# Patient Record
Sex: Female | Born: 1955 | Race: White | Hispanic: No | Smoking: Former smoker
Health system: Southern US, Community
[De-identification: ages and names within clinical notes are randomized; demographics above are authoritative.]

## PROBLEM LIST (undated history)

## (undated) DIAGNOSIS — H269 Unspecified cataract: Secondary | ICD-10-CM

## (undated) DIAGNOSIS — I10 Essential (primary) hypertension: Secondary | ICD-10-CM

## (undated) DIAGNOSIS — M549 Dorsalgia, unspecified: Secondary | ICD-10-CM

## (undated) DIAGNOSIS — G8929 Other chronic pain: Secondary | ICD-10-CM

## (undated) DIAGNOSIS — F32A Depression, unspecified: Secondary | ICD-10-CM

## (undated) DIAGNOSIS — F329 Major depressive disorder, single episode, unspecified: Secondary | ICD-10-CM

## (undated) DIAGNOSIS — G43909 Migraine, unspecified, not intractable, without status migrainosus: Secondary | ICD-10-CM

## (undated) DIAGNOSIS — R0602 Shortness of breath: Secondary | ICD-10-CM

## (undated) DIAGNOSIS — F0150 Vascular dementia without behavioral disturbance: Secondary | ICD-10-CM

## (undated) DIAGNOSIS — J449 Chronic obstructive pulmonary disease, unspecified: Secondary | ICD-10-CM

## (undated) DIAGNOSIS — K219 Gastro-esophageal reflux disease without esophagitis: Secondary | ICD-10-CM

## (undated) HISTORY — DX: Other chronic pain: G89.29

## (undated) HISTORY — PX: CHOLECYSTECTOMY: SHX55

## (undated) HISTORY — DX: Dorsalgia, unspecified: M54.9

## (undated) HISTORY — DX: Major depressive disorder, single episode, unspecified: F32.9

## (undated) HISTORY — DX: Essential (primary) hypertension: I10

## (undated) HISTORY — DX: Depression, unspecified: F32.A

---

## 2006-08-21 ENCOUNTER — Ambulatory Visit (HOSPITAL_COMMUNITY): Admission: RE | Admit: 2006-08-21 | Discharge: 2006-08-21 | Payer: Self-pay | Admitting: Internal Medicine

## 2006-08-21 ENCOUNTER — Inpatient Hospital Stay (HOSPITAL_COMMUNITY): Admission: AD | Admit: 2006-08-21 | Discharge: 2006-08-22 | Payer: Self-pay | Admitting: General Surgery

## 2006-08-22 ENCOUNTER — Encounter (INDEPENDENT_AMBULATORY_CARE_PROVIDER_SITE_OTHER): Payer: Self-pay | Admitting: General Surgery

## 2007-11-26 ENCOUNTER — Ambulatory Visit (HOSPITAL_COMMUNITY): Admission: RE | Admit: 2007-11-26 | Discharge: 2007-11-26 | Payer: Self-pay | Admitting: Internal Medicine

## 2009-01-23 ENCOUNTER — Ambulatory Visit (HOSPITAL_COMMUNITY): Admission: RE | Admit: 2009-01-23 | Discharge: 2009-01-23 | Payer: Self-pay | Admitting: Internal Medicine

## 2009-10-02 ENCOUNTER — Encounter (INDEPENDENT_AMBULATORY_CARE_PROVIDER_SITE_OTHER): Payer: Self-pay | Admitting: *Deleted

## 2009-10-18 ENCOUNTER — Encounter (INDEPENDENT_AMBULATORY_CARE_PROVIDER_SITE_OTHER): Payer: Self-pay | Admitting: *Deleted

## 2010-03-23 ENCOUNTER — Ambulatory Visit (HOSPITAL_COMMUNITY)
Admission: RE | Admit: 2010-03-23 | Discharge: 2010-03-23 | Payer: Self-pay | Source: Home / Self Care | Attending: Internal Medicine | Admitting: Internal Medicine

## 2010-04-15 ENCOUNTER — Encounter: Payer: Self-pay | Admitting: Internal Medicine

## 2010-04-25 NOTE — Letter (Signed)
Summary: Unable to Reach, Consult Scheduled  [facility]  [address]   [place], [place] [address]   Phone: [phone]  Fax: [phone]    10/02/2009  DOMINGUE COLTRAIN [address] [place], [place]  [address] Jun 30, 1955   Dear Ms. Hogue,   We have been unable to reach you by phone.  Please contact our office with an updated phone number.  At the recommendation of DR [facility]  we have been asked to schedule you a consult with DR Jena Gauss for ABDOMINAL PAIN.   Please call our office at [phone].     Thank you,    Diana Eves  [facility] R. Roetta Sessions, M.D.    Jonette Eva, M.D. Lorenza Burton, FNP-BC    Tana Coast, PA-C Phone: [phone]    Fax: [phone]

## 2010-04-25 NOTE — Letter (Signed)
Summary: Generic Letter, Intro to Referring  [facility]  [address]   [place], [place] [address]   Phone: [phone]  Fax: [phone]      October 18, 2009             RE: Bonnie Schneider   1955-06-23                 [address]                 [place], [place]  [address]  Dear Bonnie Schneider,  pt was a no show on 04/27/09, cancelled appt on 08/07/09, we are unable to reach patient by phone and mailed letter on 10/02/09.            Sincerely,    Diana Eves  [facility] Ph: [phone]   Fax: [phone]

## 2010-05-27 ENCOUNTER — Emergency Department (HOSPITAL_COMMUNITY): Payer: Medicare Other

## 2010-05-27 ENCOUNTER — Emergency Department (HOSPITAL_COMMUNITY)
Admission: EM | Admit: 2010-05-27 | Discharge: 2010-05-27 | Disposition: A | Discharge status: Home / Self Care | Payer: Medicare Other | Attending: Emergency Medicine | Admitting: Emergency Medicine

## 2010-05-27 DIAGNOSIS — J189 Pneumonia, unspecified organism: Secondary | ICD-10-CM | POA: Insufficient documentation

## 2010-05-27 DIAGNOSIS — G8929 Other chronic pain: Secondary | ICD-10-CM | POA: Insufficient documentation

## 2010-05-27 DIAGNOSIS — M549 Dorsalgia, unspecified: Secondary | ICD-10-CM | POA: Insufficient documentation

## 2010-05-27 DIAGNOSIS — G2581 Restless legs syndrome: Secondary | ICD-10-CM | POA: Insufficient documentation

## 2010-05-27 DIAGNOSIS — Z79899 Other long term (current) drug therapy: Secondary | ICD-10-CM | POA: Insufficient documentation

## 2010-05-27 DIAGNOSIS — J449 Chronic obstructive pulmonary disease, unspecified: Secondary | ICD-10-CM | POA: Insufficient documentation

## 2010-05-27 DIAGNOSIS — I10 Essential (primary) hypertension: Secondary | ICD-10-CM | POA: Insufficient documentation

## 2010-05-27 DIAGNOSIS — J4489 Other specified chronic obstructive pulmonary disease: Secondary | ICD-10-CM | POA: Insufficient documentation

## 2010-05-27 DIAGNOSIS — J40 Bronchitis, not specified as acute or chronic: Secondary | ICD-10-CM | POA: Insufficient documentation

## 2010-05-27 DIAGNOSIS — R079 Chest pain, unspecified: Secondary | ICD-10-CM | POA: Insufficient documentation

## 2010-08-07 NOTE — H&P (Signed)
NAMEBLESS, LISENBY              ACCOUNT NO.:  [id number]   MEDICAL RECORD NO.:  [id number]          PATIENT TYPE:  INP   LOCATION:  A321                          FACILITY:  [facility]   PHYSICIAN:  Dalia Heading, M.D.  DATE OF BIRTH:  11-30-1955   DATE OF ADMISSION:  08/21/2006  DATE OF DISCHARGE:  LH                              HISTORY & PHYSICAL   CHIEF COMPLAINT:  Cholecystitis, cholelithiasis.   HISTORY OF PRESENT ILLNESS:  The patient is a 55 year old white female  who presented to [facility] today for an ultrasound to work up right  upper quadrant abdominal pain, nausea, vomiting.  She was found to have  cholecystitis with cholelithiasis.  She was admitted for further  evaluation treatment.   PAST MEDICAL HISTORY:  Includes hypertension.   PAST SURGICAL HISTORY:  Unremarkable.   CURRENT MEDICATIONS:  1. Enalapril 20 mg p.o. daily.  2. Xanax as needed for anxiety.   ALLERGIES:  CODEINE which makes her nauseated.   REVIEW OF SYSTEMS:  The patient smokes.  She denies any significant  alcohol use.  She denies any other cardiopulmonary difficulties or  bleeding disorders.   PHYSICAL EXAMINATION:  GENERAL:  The patient is a well-developed, well-  nourished white female in no acute distress.  VITAL SIGNS:  She is afebrile and vital signs stable.  LUNGS: Lungs clear to auscultation with equal breath sounds bilaterally.  HEART:  Reveals regular rate and rhythm without S3, S4, or murmurs.  ABDOMEN:  Soft with tenderness on the right upper quadrant to palpation.  No hepatosplenomegaly, masses, or hernias are noted.  Met-7 is  remarkable for potassium 3.3.  Liver profile is within normal limits.  CBC is within normal limits.   IMPRESSION:  Cholecystitis, cholelithiasis.   PLAN:  The patient is scheduled for laparoscopic cholecystectomy on Aug 22, 2006.  Risks and benefits of the procedure including bleeding,  infection, hepatobiliary injury, the possibility of an open  procedure  were fully explained to the patient, gave informed consent.      Dalia Heading, M.D.  Electronically Signed     MAJ/MEDQ  D:  08/21/2006  T:  08/21/2006  Job:  [id number]   cc:   Kirk Ruths, M.D.  Fax: [phone]

## 2010-08-07 NOTE — Op Note (Signed)
Bonnie Schneider, Bonnie Schneider              ACCOUNT NO.:  [id number]   MEDICAL RECORD NO.:  [id number]          PATIENT TYPE:  INP   LOCATION:  A321                          FACILITY:  [facility]   PHYSICIAN:  Dalia Heading, M.D.  DATE OF BIRTH:  [birth year]   DATE OF PROCEDURE:  08/22/2006  DATE OF DISCHARGE:                               OPERATIVE REPORT   PREOPERATIVE DIAGNOSIS:  Cholecystitis, cholelithiasis.   POSTOPERATIVE DIAGNOSIS:  Cholecystitis, cholelithiasis.   PROCEDURE:  Laparoscopic cholecystectomy.   SURGEON:  Dr. Franky Macho   ANESTHESIA:  General endotracheal.   INDICATIONS:  The patient is a 55 year old white female who presents  with biliary colic secondary to cholelithiasis.  Risks and benefits of  the procedure including bleeding, infection, hepatobiliary injury, the  possibility of an open procedure were fully explained to the patient,  who gave informed consent.   PROCEDURE NOTE:  The patient was placed in supine position.  After  induction of general endotracheal anesthesia, the abdomen was prepped  and draped in the usual sterile technique with Betadine.  Surgical site  confirmation was performed.   An infraumbilical incision was made down to the fascia.  Veress needle  was introduced into the abdominal cavity under direct visualization  without difficulty.  The patient was placed in reversed Trendelenburg  position and additional 11-mm trocar was placed in the epigastric region  and 5-mm trocars placed in the right upper quadrant, right flank  regions.  Liver was inspected and noted to be within normal limits.  The  gallbladder was retracted superior and laterally.  The dissection was  begun around the infundibulum of the gallbladder.  The cystic duct was  first identified.  Its juncture to the infundibulum fully identified.  Endo clips placed proximally and distally on the cystic duct and the  cystic duct was divided.  This was likewise done on the cystic  artery.  The gallbladder then freed away from the gallbladder fossa using Bovie  electrocautery.  The gallbladder delivered through the epigastric trocar  site using EndoCatch bag.  The gallbladder fossa was inspected and no  abnormal bleeding or bile leakage was noted.  Surgicel was placed in the  gallbladder fossa.  All fluid and air were then evacuated from the  abdominal cavity prior to removal of the trocars.   All wounds were irrigated with normal saline.  All wounds were injected  with 0.5 cm Sensorcaine.  The infraumbilical fascia as well as  epigastric fascia reapproximated using O Vicryl interrupted sutures.  All skin incisions were closed using staples.  Betadine ointment and dry  sterile dressings were applied.   All tape and needle counts correct at the end of the procedure.  The  patient was extubated in the operating room went back to recovery room  awake and in stable condition.  Complications none.   SPECIMEN:  Gallbladder stones.   BLOOD LOSS:  Minimal.      Dalia Heading, M.D.  Electronically Signed     MAJ/MEDQ  D:  08/22/2006  T:  08/22/2006  Job:  [id number]   cc:  Kirk Ruths, M.D.  Fax: [phone]

## 2010-12-26 ENCOUNTER — Other Ambulatory Visit (HOSPITAL_COMMUNITY): Payer: Self-pay | Admitting: Internal Medicine

## 2010-12-26 DIAGNOSIS — Z139 Encounter for screening, unspecified: Secondary | ICD-10-CM

## 2010-12-31 ENCOUNTER — Ambulatory Visit (HOSPITAL_COMMUNITY)
Admission: RE | Admit: 2010-12-31 | Discharge: 2010-12-31 | Disposition: A | Discharge status: Home / Self Care | Payer: Medicare Other | Source: Ambulatory Visit | Attending: Internal Medicine | Admitting: Internal Medicine

## 2010-12-31 DIAGNOSIS — Z139 Encounter for screening, unspecified: Secondary | ICD-10-CM

## 2010-12-31 DIAGNOSIS — Z1231 Encounter for screening mammogram for malignant neoplasm of breast: Secondary | ICD-10-CM | POA: Insufficient documentation

## 2011-09-23 ENCOUNTER — Ambulatory Visit (HOSPITAL_COMMUNITY)
Admission: RE | Admit: 2011-09-23 | Discharge: 2011-09-23 | Disposition: A | Discharge status: Home / Self Care | Payer: Medicare Other | Source: Ambulatory Visit | Attending: Internal Medicine | Admitting: Internal Medicine

## 2011-09-23 ENCOUNTER — Other Ambulatory Visit (HOSPITAL_COMMUNITY): Payer: Self-pay | Admitting: Internal Medicine

## 2011-09-23 DIAGNOSIS — R109 Unspecified abdominal pain: Secondary | ICD-10-CM | POA: Insufficient documentation

## 2011-09-23 DIAGNOSIS — R1900 Intra-abdominal and pelvic swelling, mass and lump, unspecified site: Secondary | ICD-10-CM | POA: Insufficient documentation

## 2011-09-23 DIAGNOSIS — M25579 Pain in unspecified ankle and joints of unspecified foot: Secondary | ICD-10-CM | POA: Insufficient documentation

## 2011-09-23 DIAGNOSIS — R911 Solitary pulmonary nodule: Secondary | ICD-10-CM | POA: Insufficient documentation

## 2011-09-23 DIAGNOSIS — S93609A Unspecified sprain of unspecified foot, initial encounter: Secondary | ICD-10-CM

## 2011-09-23 DIAGNOSIS — S8990XA Unspecified injury of unspecified lower leg, initial encounter: Secondary | ICD-10-CM | POA: Insufficient documentation

## 2011-09-23 DIAGNOSIS — X500XXA Overexertion from strenuous movement or load, initial encounter: Secondary | ICD-10-CM | POA: Insufficient documentation

## 2011-09-24 ENCOUNTER — Other Ambulatory Visit (HOSPITAL_COMMUNITY): Payer: Self-pay | Admitting: Internal Medicine

## 2011-09-24 DIAGNOSIS — R109 Unspecified abdominal pain: Secondary | ICD-10-CM

## 2011-09-27 ENCOUNTER — Ambulatory Visit (HOSPITAL_COMMUNITY)
Admission: RE | Admit: 2011-09-27 | Discharge: 2011-09-27 | Disposition: A | Discharge status: Home / Self Care | Payer: Medicare Other | Source: Ambulatory Visit | Attending: Internal Medicine | Admitting: Internal Medicine

## 2011-09-27 DIAGNOSIS — K5730 Diverticulosis of large intestine without perforation or abscess without bleeding: Secondary | ICD-10-CM | POA: Insufficient documentation

## 2011-09-27 DIAGNOSIS — D175 Benign lipomatous neoplasm of intra-abdominal organs: Secondary | ICD-10-CM | POA: Insufficient documentation

## 2011-09-27 DIAGNOSIS — R109 Unspecified abdominal pain: Secondary | ICD-10-CM | POA: Insufficient documentation

## 2011-09-27 MED ORDER — IOHEXOL 300 MG/ML  SOLN
100.0000 mL | Freq: Once | INTRAMUSCULAR | Status: AC | PRN
  Administered 2011-09-27: 100 mL via INTRAVENOUS

## 2011-10-02 ENCOUNTER — Encounter (INDEPENDENT_AMBULATORY_CARE_PROVIDER_SITE_OTHER): Payer: Self-pay | Admitting: *Deleted

## 2011-10-21 ENCOUNTER — Encounter (INDEPENDENT_AMBULATORY_CARE_PROVIDER_SITE_OTHER): Payer: Self-pay | Admitting: Internal Medicine

## 2011-10-21 ENCOUNTER — Ambulatory Visit (INDEPENDENT_AMBULATORY_CARE_PROVIDER_SITE_OTHER): Payer: Medicare Other | Admitting: Internal Medicine

## 2011-10-21 VITALS — BP 86/48 | HR 72 | Temp 98.9°F | Ht 61.0 in | Wt 128.7 lb

## 2011-10-21 DIAGNOSIS — R140 Abdominal distension (gaseous): Secondary | ICD-10-CM | POA: Insufficient documentation

## 2011-10-21 DIAGNOSIS — R141 Gas pain: Secondary | ICD-10-CM

## 2011-10-21 DIAGNOSIS — Z1211 Encounter for screening for malignant neoplasm of colon: Secondary | ICD-10-CM | POA: Insufficient documentation

## 2011-10-21 DIAGNOSIS — G8929 Other chronic pain: Secondary | ICD-10-CM | POA: Insufficient documentation

## 2011-10-21 DIAGNOSIS — I10 Essential (primary) hypertension: Secondary | ICD-10-CM | POA: Insufficient documentation

## 2011-10-21 DIAGNOSIS — M549 Dorsalgia, unspecified: Secondary | ICD-10-CM | POA: Insufficient documentation

## 2011-10-21 NOTE — Progress Notes (Signed)
Subjective:     Patient ID: Bonnie Schneider, female   DOB: 1956/02/22, 56 y.o.   MRN: [id number]  HPI Referred to our office by Dr. Sherwood Gambler for abdominal distention x 1 yr.  She tells me that she can eat a cracker and her abdomen will be distended. She has gained about 30 pounds over the past 2-3 yrs.  Appetite is good at night. No weight loss. No abdominal pain.  She has a BM about once every 2 days. No melena or bright red rectal bleeding.  She has no problem with drinking milk.  She has never undergone a colonoscopy in the past.   CT abdomen/pelvis 09/27/2011 :IMPRESSION:  1. No acute inflammatory process within abdomen or pelvis.  2. Status post cholecystectomy.  3. A small lipoma is noted in distal duodenal wall measures 7.5  mm. Small intraluminal lipomas distal small bowel right abdomen.  4. No hydronephrosis or hydroureter. Bilateral renal symmetrical  excretion.  5. Distal colonic diverticula without evidence of acute  Diverticulitis.   10/02/2011 Acute abdomen:IMPRESSION:  1. Ill-defined heterogeneous possible nodular opacities are seen  with the medial aspect of the right lower lung. While possibly  representing atelectasis, these opacities were not seen on prior  examinations. As such, a follow-up chest radiograph in 4 to 6  weeks is recommended to ensure resolution.  2. Moderate colonic stool burden without evidence of obstruction.  These results will be called to the ordering clinician or  representative by the Radiologist Assistant, and communication  documented in the PACS Dashboard.     Review of Systems see hpi Current Outpatient Prescriptions  Medication Sig Dispense Refill  . alprazolam (XANAX) 2 MG tablet Take 2 mg by mouth as needed.       Marland Kitchen amitriptyline (ELAVIL) 50 MG tablet Take 50 mg by mouth at bedtime.      Marland Kitchen aspirin 81 MG tablet Take 81 mg by mouth daily.      . enalapril (VASOTEC) 20 MG tablet Take 20 mg by mouth daily.      . hydrochlorothiazide  (HYDRODIURIL) 25 MG tablet Take 25 mg by mouth daily.      Marland Kitchen HYDROcodone-acetaminophen (LORTAB) 10-500 MG per tablet Take 1 tablet by mouth every 6 (six) hours as needed.      . mirtazapine (REMERON SOL-TAB) 15 MG disintegrating tablet Take 15 mg by mouth at bedtime.      . rizatriptan (MAXALT) 10 MG tablet Take 10 mg by mouth as needed. May repeat in 2 hours if needed      . rOPINIRole (REQUIP) 0.25 MG tablet Take 0.25 mg by mouth 3 (three) times daily.       Past Medical History  Diagnosis Date  . Hypertension   . Chronic back pain   . Depression    Past Surgical History  Procedure Date  . Cholecystectomy    Family Status  Relation Status Death Age  . Mother Alive     left hip fx, hypertension, Leaky valve in heart  . Father Alive     good health  . Sister Alive     abdominal surgery for a rupture   History   Social History  . Marital Status: Divorced    Spouse Name: N/A    Number of Children: N/A  . Years of Education: N/A   Occupational History  . Not on file.   Social History Main Topics  . Smoking status: Current Everyday Smoker  . Smokeless tobacco: Not on  file   Comment: 1 pack a day  . Alcohol Use: No  . Drug Use: No  . Sexually Active: Not on file   Other Topics Concern  . Not on file   Social History Narrative  . No narrative on file   No Known Allergies      Objective:   Physical Exam Filed Vitals:   10/21/11 1520  Height: 5\' 1"  (1.549 m)  Weight: 128 lb 11.2 oz (58.378 kg)   Alert and oriented. Skin warm and dry. Oral mucosa is moist.   . Sclera anicteric, conjunctivae is pink. Thyroid not enlarged. No cervical lymphadenopathy. Lungs clear. Heart regular rate and rhythm.  Abdomen is soft, obese. Bowel sounds are positive. No hepatomegaly. No abdominal masses felt. No tenderness.  No edema to lower extremities.      Assessment:    Abdominal bloating.  Exam today was normal. In need of a screening colonoscopy.      Plan:    Screening  colonoscopy.  Continue Tums as needed.

## 2011-10-21 NOTE — Patient Instructions (Addendum)
Screening colonoscopy with Dr. Karilyn Cota.The risks and benefits such as perforation, bleeding, and infection were reviewed with the patient and is agreeable.

## 2011-10-24 ENCOUNTER — Telehealth (INDEPENDENT_AMBULATORY_CARE_PROVIDER_SITE_OTHER): Payer: Self-pay | Admitting: *Deleted

## 2011-10-24 ENCOUNTER — Other Ambulatory Visit (INDEPENDENT_AMBULATORY_CARE_PROVIDER_SITE_OTHER): Payer: Self-pay | Admitting: *Deleted

## 2011-10-24 DIAGNOSIS — Z1211 Encounter for screening for malignant neoplasm of colon: Secondary | ICD-10-CM

## 2011-10-24 MED ORDER — PEG-KCL-NACL-NASULF-NA ASC-C 100 G PO SOLR: 1.0000 | Freq: Once | ORAL | Status: DC

## 2011-10-24 NOTE — Telephone Encounter (Signed)
Patient needs movi prep 

## 2011-10-25 ENCOUNTER — Encounter (HOSPITAL_COMMUNITY): Payer: Self-pay | Admitting: Pharmacy Technician

## 2011-11-08 ENCOUNTER — Encounter (HOSPITAL_COMMUNITY): Payer: Self-pay | Admitting: *Deleted

## 2011-11-08 ENCOUNTER — Encounter (HOSPITAL_COMMUNITY)
Admission: RE | Disposition: A | Discharge status: Home / Self Care | Payer: Self-pay | Source: Ambulatory Visit | Attending: Internal Medicine

## 2011-11-08 ENCOUNTER — Ambulatory Visit (HOSPITAL_COMMUNITY)
Admission: RE | Admit: 2011-11-08 | Discharge: 2011-11-08 | Disposition: A | Discharge status: Home / Self Care | Payer: Medicare Other | Source: Ambulatory Visit | Attending: Internal Medicine | Admitting: Internal Medicine

## 2011-11-08 DIAGNOSIS — J4489 Other specified chronic obstructive pulmonary disease: Secondary | ICD-10-CM | POA: Insufficient documentation

## 2011-11-08 DIAGNOSIS — K644 Residual hemorrhoidal skin tags: Secondary | ICD-10-CM | POA: Insufficient documentation

## 2011-11-08 DIAGNOSIS — J449 Chronic obstructive pulmonary disease, unspecified: Secondary | ICD-10-CM | POA: Insufficient documentation

## 2011-11-08 DIAGNOSIS — Z79899 Other long term (current) drug therapy: Secondary | ICD-10-CM | POA: Insufficient documentation

## 2011-11-08 DIAGNOSIS — I10 Essential (primary) hypertension: Secondary | ICD-10-CM | POA: Insufficient documentation

## 2011-11-08 DIAGNOSIS — K5730 Diverticulosis of large intestine without perforation or abscess without bleeding: Secondary | ICD-10-CM | POA: Insufficient documentation

## 2011-11-08 DIAGNOSIS — Z1211 Encounter for screening for malignant neoplasm of colon: Secondary | ICD-10-CM

## 2011-11-08 HISTORY — PX: COLONOSCOPY: SHX5424

## 2011-11-08 HISTORY — DX: Migraine, unspecified, not intractable, without status migrainosus: G43.909

## 2011-11-08 HISTORY — DX: Unspecified cataract: H26.9

## 2011-11-08 HISTORY — DX: Shortness of breath: R06.02

## 2011-11-08 HISTORY — DX: Gastro-esophageal reflux disease without esophagitis: K21.9

## 2011-11-08 HISTORY — DX: Chronic obstructive pulmonary disease, unspecified: J44.9

## 2011-11-08 SURGERY — COLONOSCOPY
Anesthesia: Moderate Sedation

## 2011-11-08 MED ORDER — PROMETHAZINE HCL 25 MG/ML IJ SOLN
INTRAMUSCULAR | Status: DC | PRN
  Administered 2011-11-08: 12.5 mg via INTRAVENOUS

## 2011-11-08 MED ORDER — PROMETHAZINE HCL 25 MG/ML IJ SOLN
INTRAMUSCULAR | Status: AC
  Filled 2011-11-08: qty 1

## 2011-11-08 MED ORDER — STERILE WATER FOR IRRIGATION IR SOLN
Status: DC | PRN
  Administered 2011-11-08: 12:00:00

## 2011-11-08 MED ORDER — SODIUM CHLORIDE 0.45 % IV SOLN
Freq: Once | INTRAVENOUS | Status: AC
  Administered 2011-11-08: 20 mL/h via INTRAVENOUS

## 2011-11-08 MED ORDER — MEPERIDINE HCL 50 MG/ML IJ SOLN
INTRAMUSCULAR | Status: DC | PRN
  Administered 2011-11-08 (×2): 25 mg via INTRAVENOUS

## 2011-11-08 MED ORDER — SODIUM CHLORIDE 0.9 % IJ SOLN
INTRAMUSCULAR | Status: AC
  Filled 2011-11-08: qty 10

## 2011-11-08 MED ORDER — MEPERIDINE HCL 50 MG/ML IJ SOLN
INTRAMUSCULAR | Status: AC
  Filled 2011-11-08: qty 1

## 2011-11-08 MED ORDER — MIDAZOLAM HCL 5 MG/5ML IJ SOLN
INTRAMUSCULAR | Status: AC
  Filled 2011-11-08: qty 10

## 2011-11-08 MED ORDER — MIDAZOLAM HCL 5 MG/5ML IJ SOLN
INTRAMUSCULAR | Status: DC | PRN
  Administered 2011-11-08 (×5): 2 mg via INTRAVENOUS

## 2011-11-08 NOTE — H&P (Signed)
Bonnie Schneider is an 56 y.o. female.   Chief Complaint: Patient is here for colonoscopy. HPI: Asian is 56 year old Caucasian female who is here for screening colonoscopy. This is patient's first exam. She denies abdominal pain change in her bowel habits or rectal bleeding. Family history is negative for colorectal carcinoma. She has chronic low back pain and pain medication.  Past Medical History  Diagnosis Date  . Hypertension   . Chronic back pain   . Depression   . COPD (chronic obstructive pulmonary disease)   . Shortness of breath     with exertion  . Migraine     last migraine 11/07/11  . GERD (gastroesophageal reflux disease)   . Immature cataract     Past Surgical History  Procedure Date  . Cholecystectomy     History reviewed. No pertinent family history. Social History:  reports that she has been smoking Cigarettes.  She has a 60 pack-year smoking history. She does not have any smokeless tobacco history on file. She reports that she does not drink alcohol or use illicit drugs.  Allergies: No Known Allergies  Medications Prior to Admission  Medication Sig Dispense Refill  . alprazolam (XANAX) 2 MG tablet Take 2 mg by mouth as needed. Anxiety      . amitriptyline (ELAVIL) 50 MG tablet Take 50 mg by mouth at bedtime.      Marland Kitchen aspirin 81 MG tablet Take 81 mg by mouth daily.      . enalapril (VASOTEC) 20 MG tablet Take 20 mg by mouth daily.      . hydrochlorothiazide (HYDRODIURIL) 25 MG tablet Take 25 mg by mouth daily.      Marland Kitchen HYDROcodone-acetaminophen (LORTAB) 10-500 MG per tablet Take 1 tablet by mouth every 6 (six) hours as needed. Pain      . peg 3350 powder (MOVIPREP) 100 G SOLR Take 1 kit (100 g total) by mouth once.  1 kit  0  . rizatriptan (MAXALT) 10 MG tablet Take 10 mg by mouth as needed. May repeat in 2 hours if needed      . rOPINIRole (REQUIP) 0.25 MG tablet Take 0.25 mg by mouth 3 (three) times daily.        No results found for this or any previous visit  (from the past 48 hour(s)). No results found.  ROS  Blood pressure 168/99, pulse 87, temperature 97.7 F (36.5 C), temperature source Oral, resp. rate 22, height 5\' 1"  (1.549 m), weight 128 lb (58.06 kg), SpO2 93.00%. Physical Exam  Constitutional: She appears well-developed and well-nourished.  HENT:  Mouth/Throat: Oropharynx is clear and moist.  Eyes: Conjunctivae are normal. No scleral icterus.  Neck: No thyromegaly present.  Cardiovascular: Normal rate, regular rhythm and normal heart sounds.   No murmur heard. Respiratory: Effort normal and breath sounds normal.  GI: Soft. She exhibits no distension and no mass. There is tenderness (mild midepigastric tenderness).  Musculoskeletal: She exhibits no edema.  Lymphadenopathy:    She has no cervical adenopathy.  Neurological: She is alert.  Skin: Skin is warm and dry.     Assessment/Plan Average risk screening colonoscopy.  REHMAN,NAJEEB U 11/08/2011, 12:17 PM

## 2011-11-08 NOTE — Op Note (Signed)
COLONOSCOPY PROCEDURE REPORT  PATIENT:  Bonnie Schneider  MR#:  [id number] Birthdate:  1955/04/07, 56 y.o., female Endoscopist:  Dr. Malissa Hippo, MD Referred By:  Dr. Madelin Rear. Sherwood Gambler, MD Procedure Date: 11/08/2011  Procedure:   Colonoscopy  Indications:  Patient is 56 year old Caucasian female was undergoing average risk screening colonoscopy.  Informed Consent:  The procedure and risks were reviewed with the patient and informed consent was obtained.  Medications:  Demerol 50 mg IV Versed 10 mg IV Promethazine 12.5 mg IV in diluted form.  Description of procedure:  After a digital rectal exam was performed, that colonoscope was advanced from the anus through the rectum and colon to the area of the cecum, ileocecal valve and appendiceal orifice. The cecum was deeply intubated. These structures were well-seen and photographed for the record. From the level of the cecum and ileocecal valve, the scope was slowly and cautiously withdrawn. The mucosal surfaces were carefully surveyed utilizing scope tip to flexion to facilitate fold flattening as needed. The scope was pulled down into the rectum where a thorough exam including retroflexion was performed.  Findings:   Prep satisfactory. Scattered diverticula at sigmoid, descending and transverse colon. Most of the diverticula noted at sigmoid colon. Normal rectal mucosa. Small hemorrhoids below the dentate.  Therapeutic/Diagnostic Maneuvers Performed:  None  Complications:  None  Cecal Withdrawal Time:  8 minutes  Impression:  Examination performed to cecum. Data diverticula at sigmoid, descending and transverse colon. Small external hemorrhoids. No evidence of colonic polyps.  Recommendations:  Standard instructions given. Next screening exam in 10 years. Align on probiotic 1 capsule by mouth daily. If bloating persists she will return for office visit.  Janeene Sand U  11/08/2011 12:50 PM  CC: Dr. Cassell Smiles., MD &  Dr. Bonnetta Barry ref. provider found

## 2011-11-14 ENCOUNTER — Encounter (HOSPITAL_COMMUNITY): Payer: Self-pay | Admitting: Internal Medicine

## 2011-12-10 ENCOUNTER — Ambulatory Visit (HOSPITAL_COMMUNITY)
Admission: RE | Admit: 2011-12-10 | Discharge: 2011-12-10 | Disposition: A | Discharge status: Home / Self Care | Payer: Medicare Other | Source: Ambulatory Visit | Attending: Internal Medicine | Admitting: Internal Medicine

## 2011-12-10 ENCOUNTER — Other Ambulatory Visit (HOSPITAL_COMMUNITY): Payer: Self-pay | Admitting: Internal Medicine

## 2011-12-10 DIAGNOSIS — R059 Cough, unspecified: Secondary | ICD-10-CM | POA: Insufficient documentation

## 2011-12-10 DIAGNOSIS — R05 Cough: Secondary | ICD-10-CM

## 2011-12-10 DIAGNOSIS — F17200 Nicotine dependence, unspecified, uncomplicated: Secondary | ICD-10-CM | POA: Insufficient documentation

## 2012-03-21 ENCOUNTER — Encounter (HOSPITAL_COMMUNITY): Payer: Self-pay | Admitting: *Deleted

## 2012-03-21 ENCOUNTER — Emergency Department (HOSPITAL_COMMUNITY): Payer: Medicare Other

## 2012-03-21 ENCOUNTER — Emergency Department (HOSPITAL_COMMUNITY)
Admission: EM | Admit: 2012-03-21 | Discharge: 2012-03-22 | Disposition: A | Discharge status: Home / Self Care | Payer: Medicare Other | Attending: Emergency Medicine | Admitting: Emergency Medicine

## 2012-03-21 DIAGNOSIS — F329 Major depressive disorder, single episode, unspecified: Secondary | ICD-10-CM | POA: Insufficient documentation

## 2012-03-21 DIAGNOSIS — F17200 Nicotine dependence, unspecified, uncomplicated: Secondary | ICD-10-CM | POA: Insufficient documentation

## 2012-03-21 DIAGNOSIS — K219 Gastro-esophageal reflux disease without esophagitis: Secondary | ICD-10-CM | POA: Insufficient documentation

## 2012-03-21 DIAGNOSIS — R509 Fever, unspecified: Secondary | ICD-10-CM | POA: Insufficient documentation

## 2012-03-21 DIAGNOSIS — Z8679 Personal history of other diseases of the circulatory system: Secondary | ICD-10-CM | POA: Insufficient documentation

## 2012-03-21 DIAGNOSIS — R51 Headache: Secondary | ICD-10-CM | POA: Insufficient documentation

## 2012-03-21 DIAGNOSIS — R0602 Shortness of breath: Secondary | ICD-10-CM | POA: Insufficient documentation

## 2012-03-21 DIAGNOSIS — M549 Dorsalgia, unspecified: Secondary | ICD-10-CM | POA: Insufficient documentation

## 2012-03-21 DIAGNOSIS — J441 Chronic obstructive pulmonary disease with (acute) exacerbation: Secondary | ICD-10-CM | POA: Insufficient documentation

## 2012-03-21 DIAGNOSIS — J3489 Other specified disorders of nose and nasal sinuses: Secondary | ICD-10-CM | POA: Insufficient documentation

## 2012-03-21 DIAGNOSIS — Z7982 Long term (current) use of aspirin: Secondary | ICD-10-CM | POA: Insufficient documentation

## 2012-03-21 DIAGNOSIS — Z79899 Other long term (current) drug therapy: Secondary | ICD-10-CM | POA: Insufficient documentation

## 2012-03-21 DIAGNOSIS — I10 Essential (primary) hypertension: Secondary | ICD-10-CM | POA: Insufficient documentation

## 2012-03-21 DIAGNOSIS — G8929 Other chronic pain: Secondary | ICD-10-CM | POA: Insufficient documentation

## 2012-03-21 DIAGNOSIS — R079 Chest pain, unspecified: Secondary | ICD-10-CM | POA: Insufficient documentation

## 2012-03-21 DIAGNOSIS — Z8669 Personal history of other diseases of the nervous system and sense organs: Secondary | ICD-10-CM | POA: Insufficient documentation

## 2012-03-21 DIAGNOSIS — F3289 Other specified depressive episodes: Secondary | ICD-10-CM | POA: Insufficient documentation

## 2012-03-21 MED ORDER — ACETAMINOPHEN 325 MG PO TABS
650.0000 mg | ORAL_TABLET | Freq: Once | ORAL | Status: AC
  Administered 2012-03-21: 650 mg via ORAL
  Filled 2012-03-21: qty 2

## 2012-03-21 MED ORDER — ALBUTEROL SULFATE (5 MG/ML) 0.5% IN NEBU
5.0000 mg | INHALATION_SOLUTION | Freq: Once | RESPIRATORY_TRACT | Status: AC
  Administered 2012-03-21: 5 mg via RESPIRATORY_TRACT
  Filled 2012-03-21: qty 1

## 2012-03-21 MED ORDER — IPRATROPIUM BROMIDE 0.02 % IN SOLN
0.5000 mg | Freq: Once | RESPIRATORY_TRACT | Status: AC
  Administered 2012-03-21: 0.5 mg via RESPIRATORY_TRACT
  Filled 2012-03-21: qty 2.5

## 2012-03-21 MED ORDER — PREDNISONE 50 MG PO TABS
60.0000 mg | ORAL_TABLET | Freq: Once | ORAL | Status: AC
  Administered 2012-03-21: 60 mg via ORAL
  Filled 2012-03-21: qty 1

## 2012-03-21 NOTE — ED Notes (Signed)
Pt states she is short of breath, had a cough and chest pain x2 days. Pt states chest pain worse with cough and a deep breath.

## 2012-03-21 NOTE — ED Notes (Signed)
pt c/o sob, cough, fever, and chest pain x 2 days

## 2012-03-21 NOTE — ED Provider Notes (Signed)
History  Scribed for Bonnie Nielsen, MD, the patient was seen in room APA02/APA02. This chart was scribed by Bonnie Schneider. The patient's care started at 11:10 PM   CSN: [id number]  Arrival date & time 03/21/12  2220   First MD Initiated Contact with Patient 03/21/12 2301      Chief Complaint  Patient presents with  . Cough  . Chest Pain  . Shortness of Breath  . Fever     The history is provided by the patient and a relative. No language interpreter was used.   Bonnie Schneider is a 56 y.o. female who presents to the Emergency Department complaining of chest pain, SOB, and cough that started several days ago.  A family member reports that she has been experiencing congestion over the past month.  She is also experiencing fever and abdominal pain that is exacerbated by coughing.  She denies sore throat.  Pt has h/o asthma.  Pt did not receive a flu shot.  Symptoms MOD in severity  Past Medical History  Diagnosis Date  . Hypertension   . Chronic back pain   . Depression   . COPD (chronic obstructive pulmonary disease)   . Shortness of breath     with exertion  . Migraine     last migraine 11/07/11  . GERD (gastroesophageal reflux disease)   . Immature cataract     Past Surgical History  Procedure Date  . Cholecystectomy   . Colonoscopy 11/08/2011    Procedure: COLONOSCOPY;  Surgeon: Bonnie Hippo, MD;  Location: [facility] SU[facility];  Service: Endoscopy;  Laterality: N/A;  12:15    History reviewed. No pertinent family history.  History  Substance Use Topics  . Smoking status: Current Every Day Smoker -- 1.5 packs/day for 40 years    Types: Cigarettes  . Smokeless tobacco: Not on file     Comment: 1 pack a day  . Alcohol Use: No    OB History    Grav Para Term Preterm Abortions TAB SAB Ect Mult Living                  Review of Systems  Constitutional: Positive for fever.  HENT: Positive for congestion. Negative for sore throat.   Respiratory: Positive for  cough and shortness of breath.   Cardiovascular: Positive for chest pain.  Gastrointestinal: Negative for vomiting.  Genitourinary: Negative for dysuria.  Musculoskeletal: Negative for back pain.  Skin: Negative for rash.  Neurological: Positive for headaches.    Allergies  Review of patient's allergies indicates no known allergies.  Home Medications   Current Outpatient Rx  Name  Route  Sig  Dispense  Refill  . ALPRAZOLAM 2 MG PO TABS   Oral   Take 2 mg by mouth 4 (four) times daily. Anxiety         . AMITRIPTYLINE HCL 50 MG PO TABS   Oral   Take 50 mg by mouth at bedtime.         . ASPIRIN EC 81 MG PO TBEC   Oral   Take 81 mg by mouth daily.         . ENALAPRIL MALEATE 20 MG PO TABS   Oral   Take 20 mg by mouth daily.         Marland Kitchen HYDROCHLOROTHIAZIDE 25 MG PO TABS   Oral   Take 25 mg by mouth daily.         Marland Kitchen HYDROCODONE-ACETAMINOPHEN 10-500 MG PO TABS  Oral   Take 1 tablet by mouth every 6 (six) hours as needed. Pain         . FISH OIL 1000 MG PO CAPS   Oral   Take 3,000 mg by mouth daily.         Marland Kitchen RIZATRIPTAN BENZOATE 10 MG PO TABS   Oral   Take 10 mg by mouth as needed. May repeat in 2 hours if needed           BP 114/92  Pulse 98  Temp 98.1 F (36.7 C)  Resp 20  Ht 5\' 7"  (1.702 m)  Wt 131 lb (59.421 kg)  BMI 20.52 kg/m2  SpO2 91%  Physical Exam  Constitutional: She is oriented to person, place, and time. She appears well-developed and well-nourished.  HENT:  Head: Normocephalic and atraumatic.       Dry mucous membranes.    Neck: Neck supple. No tracheal deviation present.  Cardiovascular: Normal rate and regular rhythm.   Pulmonary/Chest:       Decreased breath sounds bilaterally.  Prolonged expiration with coarse bilateral breath sounds.  Some expiratory wheezes.  Reproducible sternal chest tenderness without crepitus.  Tachypnea   Abdominal: Soft. There is no tenderness.  Musculoskeletal: She exhibits no edema and no  tenderness.  Neurological: She is alert and oriented to person, place, and time.  Skin: No rash noted. She is not diaphoretic.  Psychiatric: She has a normal mood and affect. Her behavior is normal.    ED Course  Procedures   DIAGNOSTIC STUDIES: Oxygen Saturation is 91% on room air, borderline low by my interpretation.    COORDINATION OF CARE:  22:46 Ordered: DG Chest Port 1 View   Labs Reviewed - No data to display Dg Chest Port 1 View  03/21/2012  *RADIOLOGY REPORT*  Clinical Data: Cough and shortness of breath.  PORTABLE CHEST - 1 VIEW  Comparison: Chest radiograph of 12/10/2011  Findings: The lungs are well-aerated and clear.  There is no evidence of focal opacification, pleural effusion or pneumothorax. Minimal calcification is noted at the lung apices.  The cardiomediastinal silhouette is normal in size; scattered calcification is seen within the aortic arch.  No acute osseous abnormalities are seen.  IMPRESSION: No acute cardiopulmonary process seen.   Original Report Authenticated By: Tonia Ghent, M.D.      Date: 03/22/2012  Rate: 95  Rhythm: normal sinus rhythm  QRS Axis: normal  Intervals: normal  ST/T Wave abnormalities: nonspecific ST changes  Conduction Disutrbances:none  Narrative Interpretation:   Old EKG Reviewed: none available  Albuterol, steroids, CXR.  Recheck: Feeling much better after breathing treatment. Lungs sounds clear with improving pulmonary exam. Inhaler provided. Patient has scheduled followup tomorrow Monday morning with primary care physician Dr. Sherwood Schneider. Prescription for avelox, prednisone and Tylenol with Codeine provided.  Pulse ox 93% room air with ambulation - improved, patient declines admission and very much wants to go home MDM  Fever cough and wheezing in a patient with COPD. Chest x-ray and EKG reviewed as above. Medications provided. Vital signs and nursing notes reviewed.   I personally performed the services described in this  documentation, which was scribed in my presence. The recorded information has been reviewed and is accurate.         Bonnie Nielsen, MD 03/22/12 [phone]

## 2012-03-22 MED ORDER — MOXIFLOXACIN HCL 400 MG PO TABS: 400.0000 mg | ORAL_TABLET | Freq: Every day | ORAL | Status: DC

## 2012-03-22 MED ORDER — ALBUTEROL SULFATE HFA 108 (90 BASE) MCG/ACT IN AERS: 1.0000 | INHALATION_SPRAY | Freq: Four times a day (QID) | RESPIRATORY_TRACT | Status: DC | PRN

## 2012-03-22 MED ORDER — ACETAMINOPHEN-CODEINE 120-12 MG/5ML PO SUSP: 5.0000 mL | Freq: Four times a day (QID) | ORAL | Status: DC | PRN

## 2012-03-22 MED ORDER — PREDNISONE 20 MG PO TABS: 60.0000 mg | ORAL_TABLET | Freq: Every day | ORAL | Status: DC

## 2012-03-22 NOTE — ED Notes (Signed)
Pt was at 89-90% before walking jumped to 92-94% during ambulate

## 2013-01-01 ENCOUNTER — Encounter (HOSPITAL_COMMUNITY): Payer: Self-pay | Admitting: Pharmacy Technician

## 2013-01-12 ENCOUNTER — Other Ambulatory Visit: Payer: Self-pay

## 2013-01-12 ENCOUNTER — Encounter (HOSPITAL_COMMUNITY): Payer: Self-pay

## 2013-01-12 ENCOUNTER — Encounter (HOSPITAL_COMMUNITY): Payer: Self-pay | Admitting: Pharmacy Technician

## 2013-01-12 ENCOUNTER — Encounter (HOSPITAL_COMMUNITY)
Admission: RE | Admit: 2013-01-12 | Discharge: 2013-01-12 | Disposition: A | Discharge status: Home / Self Care | Payer: Medicare Other | Source: Ambulatory Visit | Attending: Ophthalmology | Admitting: Ophthalmology

## 2013-01-12 DIAGNOSIS — Z01812 Encounter for preprocedural laboratory examination: Secondary | ICD-10-CM | POA: Insufficient documentation

## 2013-01-12 LAB — BASIC METABOLIC PANEL
BUN: 19 mg/dL (ref 6–23)
CO2: 22 mEq/L (ref 19–32)
Chloride: 102 mEq/L (ref 96–112)
Creatinine, Ser: 0.92 mg/dL (ref 0.50–1.10)
GFR calc Af Amer: 79 mL/min — ABNORMAL LOW (ref >90)
Glucose, Bld: 82 mg/dL (ref 70–99)
Potassium: 4.2 mEq/L (ref 3.5–5.1)

## 2013-01-12 NOTE — Patient Instructions (Addendum)
Bonnie Schneider  01/12/2013   Your procedure is scheduled on:  01/18/2013  Report to [facility] at 12:30 AM.  Call this number if you have problems the morning of surgery: [phone]   Remember:   Do not eat food or drink liquids after midnight.   Take these medicines the morning of surgery with A SIP OF WATER: Vasotec, Elavil, Carafate, Xanax   Do not wear jewelry, make-up or nail polish.  Do not wear lotions, powders, or perfumes. You may wear deodorant.  Do not shave 48 hours prior to surgery. Men may shave face and neck.  Do not bring valuables to the hospital.  [facility] is not responsible                  for any belongings or valuables.               Contacts, dentures or bridgework may not be worn into surgery.  Leave suitcase in the car. After surgery it may be brought to your room.  For patients admitted to the hospital, discharge time is determined by your                treatment team.               Patients discharged the day of surgery will not be allowed to drive  home.  Name and phone number of your driver: melony tenpas ex-husband  Special Instructions: N/A   Please read over the following fact sheets that you were given: Care and Recovery After Surgery    Cataract A cataract is a clouding of the lens of the eye. When a lens becomes cloudy, vision is reduced based on the degree and nature of the clouding. Many cataracts reduce vision to some degree. Some cataracts make people more near-sighted as they develop. Other cataracts increase glare. Cataracts that are ignored and become worse can sometimes look white. The white color can be seen through the pupil. CAUSES   Aging. However, cataracts may occur at any age, even in newborns.  Certain drugs.  Trauma to the eye.  Certain diseases such as diabetes.  Specific eye diseases such as chronic inflammation inside the eye or a sudden attack of a rare form of glaucoma.  Inherited or acquired medical  problems. SYMPTOMS   Gradual, progressive drop in vision in the affected eye.  Severe, rapid visual loss. This most often happens when trauma is the cause. DIAGNOSIS  To detect a cataract, an eye doctor examines the lens. Cataracts are best diagnosed with an exam of the eyes with the pupils enlarged (dilated) by drops.  TREATMENT  For an early cataract, vision may improve by using different eyeglasses or stronger lighting. If that does not help your vision, surgery is the only effective treatment. A cataract needs to be surgically removed when vision loss interferes with your everyday activities, such as driving, reading, or watching TV. A cataract may also have to be removed if it prevents examination or treatment of another eye problem. Surgery removes the cloudy lens and usually replaces it with a substitute lens (intraocular lens, IOL).  At a time when both you and your doctor agree, the cataract will be surgically removed. If you have cataracts in both eyes, only one is usually removed at a time. This allows the operated eye to heal and be out of danger from any possible problems after surgery (such as infection or poor wound healing). In rare cases, a  cataract may be doing damage to your eye. In these cases, your caregiver may advise surgical removal right away. The vast majority of people who have cataract surgery have better vision afterward. HOME CARE INSTRUCTIONS  If you are not planning surgery, you may be asked to do the following:  Use different eyeglasses.  Use stronger or brighter lighting.  Ask your eye doctor about reducing your medicine dose or changing medicines if it is thought that a medicine caused your cataract. Changing medicines does not make the cataract go away on its own.  Become familiar with your surroundings. Poor vision can lead to injury. Avoid bumping into things on the affected side. You are at a higher risk for tripping or falling.  Exercise extreme care when  driving or operating machinery.  Wear sunglasses if you are sensitive to bright light or experiencing problems with glare. SEEK IMMEDIATE MEDICAL CARE IF:   You have a worsening or sudden vision loss.  You notice redness, swelling, or increasing pain in the eye.  You have a fever. Document Released: 03/11/2005 Document Revised: 06/03/2011 Document Reviewed: 11/02/2010 [facility] Patient Information 2014 [place], [place].

## 2013-01-15 MED ORDER — TETRACAINE HCL 0.5 % OP SOLN
OPHTHALMIC | Status: AC
  Filled 2013-01-15: qty 2

## 2013-01-15 MED ORDER — NEOMYCIN-POLYMYXIN-DEXAMETH 3.5-10000-0.1 OP SUSP
OPHTHALMIC | Status: AC
  Filled 2013-01-15: qty 5

## 2013-01-15 MED ORDER — LIDOCAINE HCL 3.5 % OP GEL
OPHTHALMIC | Status: AC
  Filled 2013-01-15: qty 1

## 2013-01-15 MED ORDER — PHENYLEPHRINE HCL 2.5 % OP SOLN
OPHTHALMIC | Status: AC
  Filled 2013-01-15: qty 15

## 2013-01-15 MED ORDER — LIDOCAINE HCL (PF) 1 % IJ SOLN
INTRAMUSCULAR | Status: AC
  Filled 2013-01-15: qty 2

## 2013-01-15 MED ORDER — CYCLOPENTOLATE-PHENYLEPHRINE OP SOLN OPTIME - NO CHARGE
OPHTHALMIC | Status: AC
  Filled 2013-01-15: qty 2

## 2013-01-18 ENCOUNTER — Ambulatory Visit (HOSPITAL_COMMUNITY)
Admission: RE | Admit: 2013-01-18 | Discharge: 2013-01-18 | Disposition: A | Discharge status: Home / Self Care | Payer: Medicare Other | Source: Ambulatory Visit | Attending: Ophthalmology | Admitting: Ophthalmology

## 2013-01-18 ENCOUNTER — Encounter (HOSPITAL_COMMUNITY): Payer: Self-pay | Admitting: *Deleted

## 2013-01-18 ENCOUNTER — Encounter (HOSPITAL_COMMUNITY)
Admission: RE | Disposition: A | Discharge status: Home / Self Care | Payer: Self-pay | Source: Ambulatory Visit | Attending: Ophthalmology

## 2013-01-18 ENCOUNTER — Ambulatory Visit (HOSPITAL_COMMUNITY): Payer: Medicare Other | Admitting: Anesthesiology

## 2013-01-18 ENCOUNTER — Encounter (HOSPITAL_COMMUNITY): Payer: Medicare Other | Admitting: Anesthesiology

## 2013-01-18 DIAGNOSIS — I10 Essential (primary) hypertension: Secondary | ICD-10-CM | POA: Insufficient documentation

## 2013-01-18 DIAGNOSIS — J449 Chronic obstructive pulmonary disease, unspecified: Secondary | ICD-10-CM | POA: Insufficient documentation

## 2013-01-18 DIAGNOSIS — Z01810 Encounter for preprocedural cardiovascular examination: Secondary | ICD-10-CM | POA: Insufficient documentation

## 2013-01-18 DIAGNOSIS — J4489 Other specified chronic obstructive pulmonary disease: Secondary | ICD-10-CM | POA: Insufficient documentation

## 2013-01-18 DIAGNOSIS — H2510 Age-related nuclear cataract, unspecified eye: Secondary | ICD-10-CM | POA: Insufficient documentation

## 2013-01-18 HISTORY — PX: CATARACT EXTRACTION W/PHACO: SHX586

## 2013-01-18 SURGERY — PHACOEMULSIFICATION, CATARACT, WITH IOL INSERTION
Anesthesia: Monitor Anesthesia Care | Site: Eye | Laterality: Right | Wound class: Clean

## 2013-01-18 MED ORDER — LIDOCAINE HCL 3.5 % OP GEL
1.0000 "application " | Freq: Once | OPHTHALMIC | Status: AC
  Administered 2013-01-18: 1 via OPHTHALMIC

## 2013-01-18 MED ORDER — PROVISC 10 MG/ML IO SOLN
INTRAOCULAR | Status: DC | PRN
  Administered 2013-01-18: 8.5 mg via INTRAOCULAR

## 2013-01-18 MED ORDER — FENTANYL CITRATE 0.05 MG/ML IJ SOLN: 25.0000 ug | INTRAMUSCULAR | Status: DC | PRN

## 2013-01-18 MED ORDER — LIDOCAINE HCL (PF) 1 % IJ SOLN
INTRAMUSCULAR | Status: DC | PRN
  Administered 2013-01-18: .6 mL

## 2013-01-18 MED ORDER — LACTATED RINGERS IV SOLN
INTRAVENOUS | Status: DC | PRN
  Administered 2013-01-18: 13:00:00 via INTRAVENOUS

## 2013-01-18 MED ORDER — CYCLOPENTOLATE-PHENYLEPHRINE 0.2-1 % OP SOLN
1.0000 [drp] | OPHTHALMIC | Status: AC
  Administered 2013-01-18 (×3): 1 [drp] via OPHTHALMIC

## 2013-01-18 MED ORDER — FENTANYL CITRATE 0.05 MG/ML IJ SOLN
INTRAMUSCULAR | Status: AC
  Filled 2013-01-18: qty 2

## 2013-01-18 MED ORDER — POVIDONE-IODINE 5 % OP SOLN
OPHTHALMIC | Status: DC | PRN
  Administered 2013-01-18: 1 via OPHTHALMIC

## 2013-01-18 MED ORDER — MIDAZOLAM HCL 2 MG/2ML IJ SOLN
1.0000 mg | INTRAMUSCULAR | Status: DC | PRN
  Administered 2013-01-18: 2 mg via INTRAVENOUS

## 2013-01-18 MED ORDER — ONDANSETRON HCL 4 MG/2ML IJ SOLN: 4.0000 mg | Freq: Once | INTRAMUSCULAR | Status: DC | PRN

## 2013-01-18 MED ORDER — EPINEPHRINE HCL 1 MG/ML IJ SOLN
INTRAMUSCULAR | Status: AC
  Filled 2013-01-18: qty 1

## 2013-01-18 MED ORDER — LACTATED RINGERS IV SOLN
INTRAVENOUS | Status: DC
  Administered 2013-01-18: 1000 mL via INTRAVENOUS

## 2013-01-18 MED ORDER — MIDAZOLAM HCL 5 MG/5ML IJ SOLN
INTRAMUSCULAR | Status: AC
  Filled 2013-01-18: qty 5

## 2013-01-18 MED ORDER — FENTANYL CITRATE 0.05 MG/ML IJ SOLN
25.0000 ug | INTRAMUSCULAR | Status: AC
  Administered 2013-01-18: 25 ug via INTRAVENOUS

## 2013-01-18 MED ORDER — EPINEPHRINE HCL 1 MG/ML IJ SOLN
INTRAOCULAR | Status: DC | PRN
  Administered 2013-01-18: 14:00:00

## 2013-01-18 MED ORDER — TETRACAINE HCL 0.5 % OP SOLN
1.0000 [drp] | OPHTHALMIC | Status: AC
  Administered 2013-01-18 (×3): 1 [drp] via OPHTHALMIC

## 2013-01-18 MED ORDER — NEOMYCIN-POLYMYXIN-DEXAMETH 3.5-10000-0.1 OP SUSP
OPHTHALMIC | Status: DC | PRN
  Administered 2013-01-18: 1 [drp] via OPHTHALMIC

## 2013-01-18 MED ORDER — PHENYLEPHRINE HCL 2.5 % OP SOLN
1.0000 [drp] | OPHTHALMIC | Status: AC
  Administered 2013-01-18 (×3): 1 [drp] via OPHTHALMIC

## 2013-01-18 MED ORDER — BSS IO SOLN
INTRAOCULAR | Status: DC | PRN
  Administered 2013-01-18: 15 mL via INTRAOCULAR

## 2013-01-18 SURGICAL SUPPLY — 32 items

## 2013-01-18 NOTE — Op Note (Signed)
Date of Admission: 01/18/2013  Date of Surgery: 01/18/2013  Pre-Op Dx: Cataract  Right  Eye  Post-Op Dx: Nuclear Cataract  Right  Eye,  Dx Code 366.16  Surgeon: Gemma Payor, M.D.  Assistants: None  Anesthesia: Topical with MAC  Indications: Painless, progressive loss of vision with compromise of daily activities.  Surgery: Cataract Extraction with Intraocular lens Implant Right Eye  Discription: The patient had dilating drops and viscous lidocaine placed into the right eye in the pre-op holding area. After transfer to the operating room, a time out was performed. The patient was then prepped and draped. Beginning with a 75 degree blade a paracentesis port was made at the surgeon's 2 o'clock position. The anterior chamber was then filled with 1% non-preserved lidocaine. This was followed by filling the anterior chamber with Provisc.  A 2.64mm keratome blade was used to make a clear corneal incision at the temporal limbus.  A bent cystatome needle was used to create a continuous tear capsulotomy. Hydrodissection was performed with balanced salt solution on a Fine canula. The lens nucleus was then removed using the phacoemulsification handpiece. Residual cortex was removed with the I&A handpiece. The anterior chamber and capsular bag were refilled with Provisc. A posterior chamber intraocular lens was placed into the capsular bag with it's injector. The implant was positioned with the Kuglan hook. The Provisc was then removed from the anterior chamber and capsular bag with the I&A handpiece. Stromal hydration of the main incision and paracentesis port was performed with BSS on a Fine canula. The wounds were tested for leak which was negative. The patient tolerated the procedure well. There were no operative complications. The patient was then transferred to the recovery room in stable condition.  Complications: None  Specimen: None  EBL: None  Prosthetic device: B&L enVista, MX60, power 22.5D,  SN [id number].

## 2013-01-18 NOTE — Anesthesia Postprocedure Evaluation (Signed)
  Anesthesia Post-op Note  Patient: Bonnie Schneider  Procedure(s) Performed: Procedure(s) with comments: CATARACT EXTRACTION PHACO AND INTRAOCULAR LENS PLACEMENT (IOC) (Right) - CDE:12.85  Patient Location: Short Stay  Anesthesia Type:MAC  Level of Consciousness: awake, alert , oriented and patient cooperative  Airway and Oxygen Therapy: Patient Spontanous Breathing  Post-op Pain: none  Post-op Assessment: Post-op Vital signs reviewed, Patient's Cardiovascular Status Stable, Respiratory Function Stable, Patent Airway and Pain level controlled  Post-op Vital Signs: Reviewed and stable  Complications: No apparent anesthesia complications

## 2013-01-18 NOTE — Anesthesia Preprocedure Evaluation (Signed)
Anesthesia Evaluation  Patient identified by MRN, date of birth, ID band Patient awake    Reviewed: Allergy & Precautions, H&P , NPO status , Patient's Chart, lab work & pertinent test results  Airway Mallampati: II TM Distance: >3 FB     Dental  (+) Edentulous Upper and Edentulous Lower   Pulmonary shortness of breath, COPD breath sounds clear to auscultation        Cardiovascular hypertension, Pt. on medications Rhythm:Regular Rate:Normal     Neuro/Psych  Headaches, PSYCHIATRIC DISORDERS Depression    GI/Hepatic GERD-  ,  Endo/Other    Renal/GU      Musculoskeletal   Abdominal   Peds  Hematology   Anesthesia Other Findings   Reproductive/Obstetrics                           Anesthesia Physical Anesthesia Plan  ASA: III  Anesthesia Plan: MAC   Post-op Pain Management:    Induction: Intravenous  Airway Management Planned: Nasal Cannula  Additional Equipment:   Intra-op Plan:   Post-operative Plan:   Informed Consent: I have reviewed the patients History and Physical, chart, labs and discussed the procedure including the risks, benefits and alternatives for the proposed anesthesia with the patient or authorized representative who has indicated his/her understanding and acceptance.     Plan Discussed with:   Anesthesia Plan Comments:         Anesthesia Quick Evaluation  

## 2013-01-18 NOTE — Transfer of Care (Signed)
Immediate Anesthesia Transfer of Care Note  Patient: Bonnie Schneider  Procedure(s) Performed: Procedure(s) with comments: CATARACT EXTRACTION PHACO AND INTRAOCULAR LENS PLACEMENT (IOC) (Right) - CDE:12.85  Patient Location: Short Stay  Anesthesia Type:MAC  Level of Consciousness: awake, alert , oriented and patient cooperative  Airway & Oxygen Therapy: Patient Spontanous Breathing  Post-op Assessment: Report given to PACU RN and Post -op Vital signs reviewed and stable  Post vital signs: Reviewed and stable  Complications: No apparent anesthesia complications

## 2013-01-18 NOTE — H&P (Signed)
I have reviewed the H&P, the patient was re-examined, and I have identified no interval changes in medical condition and plan of care since the history and physical of record  

## 2013-01-20 ENCOUNTER — Encounter (HOSPITAL_COMMUNITY): Payer: Self-pay | Admitting: Ophthalmology

## 2013-01-26 ENCOUNTER — Encounter (HOSPITAL_COMMUNITY): Payer: Self-pay | Admitting: Pharmacy Technician

## 2013-01-27 ENCOUNTER — Encounter (HOSPITAL_COMMUNITY): Admission: RE | Admit: 2013-01-27 | Payer: Medicare Other | Source: Ambulatory Visit

## 2013-01-27 MED ORDER — PHENYLEPHRINE HCL 2.5 % OP SOLN
OPHTHALMIC | Status: AC
  Filled 2013-01-27: qty 15

## 2013-01-28 ENCOUNTER — Encounter (HOSPITAL_COMMUNITY): Payer: Medicare Other | Admitting: Anesthesiology

## 2013-01-28 ENCOUNTER — Encounter (HOSPITAL_COMMUNITY): Payer: Self-pay | Admitting: Ophthalmology

## 2013-01-28 ENCOUNTER — Encounter (HOSPITAL_COMMUNITY)
Admission: RE | Disposition: A | Discharge status: Home / Self Care | Payer: Self-pay | Source: Ambulatory Visit | Attending: Ophthalmology

## 2013-01-28 ENCOUNTER — Ambulatory Visit (HOSPITAL_COMMUNITY)
Admission: RE | Admit: 2013-01-28 | Discharge: 2013-01-28 | Disposition: A | Discharge status: Home / Self Care | Payer: Medicare Other | Source: Ambulatory Visit | Attending: Ophthalmology | Admitting: Ophthalmology

## 2013-01-28 ENCOUNTER — Ambulatory Visit (HOSPITAL_COMMUNITY): Payer: Medicare Other | Admitting: Anesthesiology

## 2013-01-28 DIAGNOSIS — H2510 Age-related nuclear cataract, unspecified eye: Secondary | ICD-10-CM | POA: Insufficient documentation

## 2013-01-28 DIAGNOSIS — J4489 Other specified chronic obstructive pulmonary disease: Secondary | ICD-10-CM | POA: Insufficient documentation

## 2013-01-28 DIAGNOSIS — J449 Chronic obstructive pulmonary disease, unspecified: Secondary | ICD-10-CM | POA: Insufficient documentation

## 2013-01-28 DIAGNOSIS — I10 Essential (primary) hypertension: Secondary | ICD-10-CM | POA: Insufficient documentation

## 2013-01-28 HISTORY — PX: CATARACT EXTRACTION W/PHACO: SHX586

## 2013-01-28 SURGERY — PHACOEMULSIFICATION, CATARACT, WITH IOL INSERTION
Anesthesia: Monitor Anesthesia Care | Site: Eye | Laterality: Left | Wound class: Clean

## 2013-01-28 MED ORDER — MIDAZOLAM HCL 5 MG/5ML IJ SOLN
INTRAMUSCULAR | Status: AC
  Filled 2013-01-28: qty 5

## 2013-01-28 MED ORDER — LIDOCAINE HCL 3.5 % OP GEL
1.0000 "application " | Freq: Once | OPHTHALMIC | Status: AC
  Administered 2013-01-28: 1 via OPHTHALMIC

## 2013-01-28 MED ORDER — NEOMYCIN-POLYMYXIN-DEXAMETH 3.5-10000-0.1 OP SUSP
OPHTHALMIC | Status: AC
  Filled 2013-01-28: qty 5

## 2013-01-28 MED ORDER — PROVISC 10 MG/ML IO SOLN
INTRAOCULAR | Status: DC | PRN
  Administered 2013-01-28: 0.85 mL via INTRAOCULAR

## 2013-01-28 MED ORDER — CYCLOPENTOLATE-PHENYLEPHRINE 0.2-1 % OP SOLN
1.0000 [drp] | OPHTHALMIC | Status: AC
  Administered 2013-01-28 (×2): 1 [drp] via OPHTHALMIC

## 2013-01-28 MED ORDER — FENTANYL CITRATE 0.05 MG/ML IJ SOLN
INTRAMUSCULAR | Status: AC
  Filled 2013-01-28: qty 2

## 2013-01-28 MED ORDER — POVIDONE-IODINE 5 % OP SOLN
OPHTHALMIC | Status: DC | PRN
  Administered 2013-01-28: 1 via OPHTHALMIC

## 2013-01-28 MED ORDER — BSS IO SOLN
INTRAOCULAR | Status: DC | PRN
  Administered 2013-01-28: 15 mL via INTRAOCULAR

## 2013-01-28 MED ORDER — MIDAZOLAM HCL 2 MG/2ML IJ SOLN
1.0000 mg | INTRAMUSCULAR | Status: DC | PRN
Start: 2013-01-28 — End: 2013-01-28
  Administered 2013-01-28: 2 mg via INTRAVENOUS

## 2013-01-28 MED ORDER — LACTATED RINGERS IV SOLN
INTRAVENOUS | Status: DC
  Administered 2013-01-28: 14:00:00 via INTRAVENOUS

## 2013-01-28 MED ORDER — CYCLOPENTOLATE-PHENYLEPHRINE OP SOLN OPTIME - NO CHARGE
OPHTHALMIC | Status: AC
  Filled 2013-01-28: qty 2

## 2013-01-28 MED ORDER — EPINEPHRINE HCL 1 MG/ML IJ SOLN
INTRAOCULAR | Status: DC | PRN
  Administered 2013-01-28: 14:00:00

## 2013-01-28 MED ORDER — TETRACAINE HCL 0.5 % OP SOLN
1.0000 [drp] | OPHTHALMIC | Status: AC
  Administered 2013-01-28 (×2): 1 [drp] via OPHTHALMIC

## 2013-01-28 MED ORDER — LIDOCAINE HCL 3.5 % OP GEL
OPHTHALMIC | Status: AC
  Filled 2013-01-28: qty 1

## 2013-01-28 MED ORDER — ONDANSETRON HCL 4 MG/2ML IJ SOLN: 4.0000 mg | Freq: Once | INTRAMUSCULAR | Status: DC | PRN

## 2013-01-28 MED ORDER — LIDOCAINE HCL (PF) 1 % IJ SOLN
INTRAMUSCULAR | Status: AC
  Filled 2013-01-28: qty 2

## 2013-01-28 MED ORDER — NEOMYCIN-POLYMYXIN-DEXAMETH 3.5-10000-0.1 OP SUSP
OPHTHALMIC | Status: DC | PRN
  Administered 2013-01-28: 1 [drp] via OPHTHALMIC

## 2013-01-28 MED ORDER — PHENYLEPHRINE HCL 2.5 % OP SOLN
1.0000 [drp] | OPHTHALMIC | Status: AC
  Administered 2013-01-28 (×2): 1 [drp] via OPHTHALMIC

## 2013-01-28 MED ORDER — LIDOCAINE 3.5 % OP GEL OPTIME - NO CHARGE
OPHTHALMIC | Status: DC | PRN
  Administered 2013-01-28: 1 [drp] via OPHTHALMIC

## 2013-01-28 MED ORDER — FENTANYL CITRATE 0.05 MG/ML IJ SOLN
25.0000 ug | INTRAMUSCULAR | Status: AC
  Administered 2013-01-28 (×2): 25 ug via INTRAVENOUS

## 2013-01-28 MED ORDER — EPINEPHRINE HCL 1 MG/ML IJ SOLN
INTRAMUSCULAR | Status: AC
  Filled 2013-01-28: qty 1

## 2013-01-28 MED ORDER — LIDOCAINE HCL (PF) 1 % IJ SOLN
INTRAMUSCULAR | Status: DC | PRN
  Administered 2013-01-28: .4 mL

## 2013-01-28 MED ORDER — FENTANYL CITRATE 0.05 MG/ML IJ SOLN: 25.0000 ug | INTRAMUSCULAR | Status: DC | PRN

## 2013-01-28 SURGICAL SUPPLY — 32 items
CAPSULAR TENSION RING-AMO (OPHTHALMIC RELATED) IMPLANT
CLOTH BEACON ORANGE TIMEOUT ST (SAFETY) ×2 IMPLANT
EYE SHIELD UNIVERSAL CLEAR (GAUZE/BANDAGES/DRESSINGS) ×2 IMPLANT
GLOVE BIO SURGEON STRL SZ 6.5 (GLOVE) IMPLANT
GLOVE BIOGEL PI IND STRL 6.5 (GLOVE) ×1 IMPLANT
GLOVE BIOGEL PI IND STRL 7.0 (GLOVE) IMPLANT
GLOVE BIOGEL PI IND STRL 7.5 (GLOVE) IMPLANT
GLOVE BIOGEL PI INDICATOR 6.5 (GLOVE) ×1
GLOVE BIOGEL PI INDICATOR 7.0 (GLOVE)
GLOVE BIOGEL PI INDICATOR 7.5 (GLOVE)
GLOVE ECLIPSE 6.5 STRL STRAW (GLOVE) IMPLANT
GLOVE ECLIPSE 7.0 STRL STRAW (GLOVE) IMPLANT
GLOVE ECLIPSE 7.5 STRL STRAW (GLOVE) IMPLANT
GLOVE EXAM NITRILE LRG STRL (GLOVE) IMPLANT
GLOVE EXAM NITRILE MD LF STRL (GLOVE) IMPLANT
GLOVE SKINSENSE NS SZ6.5 (GLOVE)
GLOVE SKINSENSE NS SZ7.0 (GLOVE)
GLOVE SKINSENSE STRL SZ6.5 (GLOVE) IMPLANT
GLOVE SKINSENSE STRL SZ7.0 (GLOVE) IMPLANT
KIT VITRECTOMY (OPHTHALMIC RELATED) IMPLANT
PAD ARMBOARD 7.5X6 YLW CONV (MISCELLANEOUS) ×2 IMPLANT
PROC W NO LENS (INTRAOCULAR LENS)
PROC W SPEC LENS (INTRAOCULAR LENS)
PROCESS W NO LENS (INTRAOCULAR LENS) IMPLANT
PROCESS W SPEC LENS (INTRAOCULAR LENS) IMPLANT
RING MALYGIN (MISCELLANEOUS) IMPLANT
SIGHTPATH CAT PROC W REG LENS (Ophthalmic Related) ×2 IMPLANT
SYR TB 1ML LL NO SAFETY (SYRINGE) ×2 IMPLANT
TAPE SURG TRANSPORE 1 IN (GAUZE/BANDAGES/DRESSINGS) ×1 IMPLANT
TAPE SURGICAL TRANSPORE 1 IN (GAUZE/BANDAGES/DRESSINGS) ×1
VISCOELASTIC ADDITIONAL (OPHTHALMIC RELATED) IMPLANT
WATER STERILE IRR 250ML POUR (IV SOLUTION) ×2 IMPLANT

## 2013-01-28 NOTE — Transfer of Care (Signed)
Immediate Anesthesia Transfer of Care Note  Patient: Bonnie Schneider  Procedure(s) Performed: Procedure(s) with comments: CATARACT EXTRACTION PHACO AND INTRAOCULAR LENS PLACEMENT (IOC) (Left) - CDE:7.93  Patient Location: Short Stay  Anesthesia Type:MAC  Level of Consciousness: awake, alert , oriented and patient cooperative  Airway & Oxygen Therapy: Patient Spontanous Breathing  Post-op Assessment: Report given to PACU RN and Post -op Vital signs reviewed and stable  Post vital signs: Reviewed and stable  Complications: No apparent anesthesia complications

## 2013-01-28 NOTE — Anesthesia Preprocedure Evaluation (Signed)
Anesthesia Evaluation  Patient identified by MRN, date of birth, ID band Patient awake    Reviewed: Allergy & Precautions, H&P , NPO status , Patient's Chart, lab work & pertinent test results  Airway Mallampati: II TM Distance: >3 FB     Dental  (+) Edentulous Upper and Edentulous Lower   Pulmonary shortness of breath, COPD breath sounds clear to auscultation        Cardiovascular hypertension, Pt. on medications Rhythm:Regular Rate:Normal     Neuro/Psych  Headaches, PSYCHIATRIC DISORDERS Depression    GI/Hepatic GERD-  ,  Endo/Other    Renal/GU      Musculoskeletal   Abdominal   Peds  Hematology   Anesthesia Other Findings   Reproductive/Obstetrics                           Anesthesia Physical Anesthesia Plan  ASA: III  Anesthesia Plan: MAC   Post-op Pain Management:    Induction: Intravenous  Airway Management Planned: Nasal Cannula  Additional Equipment:   Intra-op Plan:   Post-operative Plan:   Informed Consent: I have reviewed the patients History and Physical, chart, labs and discussed the procedure including the risks, benefits and alternatives for the proposed anesthesia with the patient or authorized representative who has indicated his/her understanding and acceptance.     Plan Discussed with:   Anesthesia Plan Comments:         Anesthesia Quick Evaluation

## 2013-01-28 NOTE — Anesthesia Postprocedure Evaluation (Signed)
  Anesthesia Post-op Note  Patient: Bonnie Schneider  Procedure(s) Performed: Procedure(s) with comments: CATARACT EXTRACTION PHACO AND INTRAOCULAR LENS PLACEMENT (IOC) (Left) - CDE:7.93  Patient Location: Short Stay  Anesthesia Type:MAC  Level of Consciousness: awake, alert , oriented and patient cooperative  Airway and Oxygen Therapy: Patient Spontanous Breathing  Post-op Pain: none  Post-op Assessment: Post-op Vital signs reviewed, Patient's Cardiovascular Status Stable, Respiratory Function Stable, Patent Airway, No signs of Nausea or vomiting and Pain level controlled  Post-op Vital Signs: Reviewed and stable  Complications: No apparent anesthesia complications

## 2013-01-28 NOTE — Anesthesia Procedure Notes (Signed)
Procedure Name: MAC Date/Time: 01/28/2013 2:22 PM Performed by: Pernell Dupre, Riyan Haile A Pre-anesthesia Checklist: Patient identified, Timeout performed, Emergency Drugs available, Suction available and Patient being monitored Oxygen Delivery Method: Nasal cannula

## 2013-01-28 NOTE — Preoperative (Signed)
Beta Blockers   Reason not to administer Beta Blockers:Not Applicable 

## 2013-01-28 NOTE — H&P (Signed)
I have reviewed the H&P, the patient was re-examined, and I have identified no interval changes in medical condition and plan of care since the history and physical of record  

## 2013-01-28 NOTE — Op Note (Signed)
Date of Admission: 01/28/2013  Date of Surgery: 01/28/2013  Pre-Op Dx: Cataract  Left  Eye  Post-Op Dx: Cataract  Left  Eye,  Dx Code 366.16  Surgeon: Gemma Payor, M.D.  Assistants: None  Anesthesia: Topical with MAC  Indications: Painless, progressive loss of vision with compromise of daily activities.  Surgery: Cataract Extraction with Intraocular lens Implant Left Eye  Discription: The patient had dilating drops and viscous lidocaine placed into the left eye in the pre-op holding area. After transfer to the operating room, a time out was performed. The patient was then prepped and draped. Beginning with a 75 degree blade a paracentesis port was made at the surgeon's 2 o'clock position. The anterior chamber was then filled with 1% non-preserved lidocaine. This was followed by filling the anterior chamber with Provisc. A 2.19mm keratome blade was used to make a clear corneal incision at the temporal limbus. A bent cystatome needle was used to create a continuous tear capsulotomy. Hydrodissection was performed with balanced salt solution on a Fine canula. The lens nucleus was then removed using the phacoemulsification handpiece. Residual cortex was removed with the I&A handpiece. The anterior chamber and capsular bag were refilled with Provisc. A posterior chamber intraocular lens was placed into the capsular bag with it's injector. The implant was positioned with the Kuglan hook. The Provisc was then removed from the anterior chamber and capsular bag with the I&A handpiece. Stromal hydration of the main incision and paracentesis port was performed with BSS on a Fine canula. The wounds were tested for leak which was negative. The patient tolerated the procedure well. There were no operative complications. The patient was then transferred to the recovery room in stable condition.  Complications: None  Specimen: None  EBL: None  Prosthetic device: B&L enVista, MX60, power 22.0D, SN  [id number].

## 2013-02-01 ENCOUNTER — Encounter (HOSPITAL_COMMUNITY): Payer: Self-pay | Admitting: Ophthalmology

## 2013-06-29 ENCOUNTER — Ambulatory Visit (HOSPITAL_COMMUNITY)
Admission: RE | Admit: 2013-06-29 | Discharge: 2013-06-29 | Disposition: A | Discharge status: Home / Self Care | Payer: Medicare Other | Source: Ambulatory Visit | Attending: Internal Medicine | Admitting: Internal Medicine

## 2013-06-29 ENCOUNTER — Other Ambulatory Visit (HOSPITAL_COMMUNITY): Payer: Self-pay | Admitting: Internal Medicine

## 2013-06-29 DIAGNOSIS — M545 Low back pain, unspecified: Secondary | ICD-10-CM | POA: Insufficient documentation

## 2013-06-29 DIAGNOSIS — M549 Dorsalgia, unspecified: Secondary | ICD-10-CM

## 2013-06-29 DIAGNOSIS — M51379 Other intervertebral disc degeneration, lumbosacral region without mention of lumbar back pain or lower extremity pain: Secondary | ICD-10-CM | POA: Insufficient documentation

## 2013-06-29 DIAGNOSIS — M5137 Other intervertebral disc degeneration, lumbosacral region: Secondary | ICD-10-CM | POA: Insufficient documentation

## 2013-06-29 DIAGNOSIS — M79609 Pain in unspecified limb: Secondary | ICD-10-CM | POA: Insufficient documentation

## 2013-12-28 ENCOUNTER — Telehealth: Payer: Self-pay | Admitting: Neurology

## 2013-12-28 NOTE — Telephone Encounter (Signed)
Pt had a death in the family and canceled her appt on 12-29-13

## 2013-12-29 ENCOUNTER — Ambulatory Visit: Payer: Medicare Other | Admitting: Neurology

## 2013-12-31 ENCOUNTER — Ambulatory Visit: Payer: Medicare Other | Admitting: Neurology

## 2014-01-03 ENCOUNTER — Telehealth: Payer: Self-pay | Admitting: Neurology

## 2014-01-03 NOTE — Telephone Encounter (Signed)
Pt cancelled 12/31/13 NP appt w/ Dr. Posey Pronto. It was documented in the EPIC referral but we need to make sure the referring provider is notified as well, I do not believe the referring provider is part of [place] and will not be able to view the notification via EPIC. / Sherri S>

## 2014-01-03 NOTE — Telephone Encounter (Signed)
Called and notified the referring Dr office of cancel appt

## 2014-04-02 IMAGING — CR DG ABDOMEN ACUTE W/ 1V CHEST
3 series · 3 of 3 positions shown · non-contrast
Comparison: Chest radiograph - 05/27/2010; 08/21/2006

CLINICAL DATA: Abdominal pain and swelling

ACUTE ABDOMEN SERIES (ABDOMEN 2 VIEW & CHEST 1 VIEW)

[view not recorded (1 of 3)]
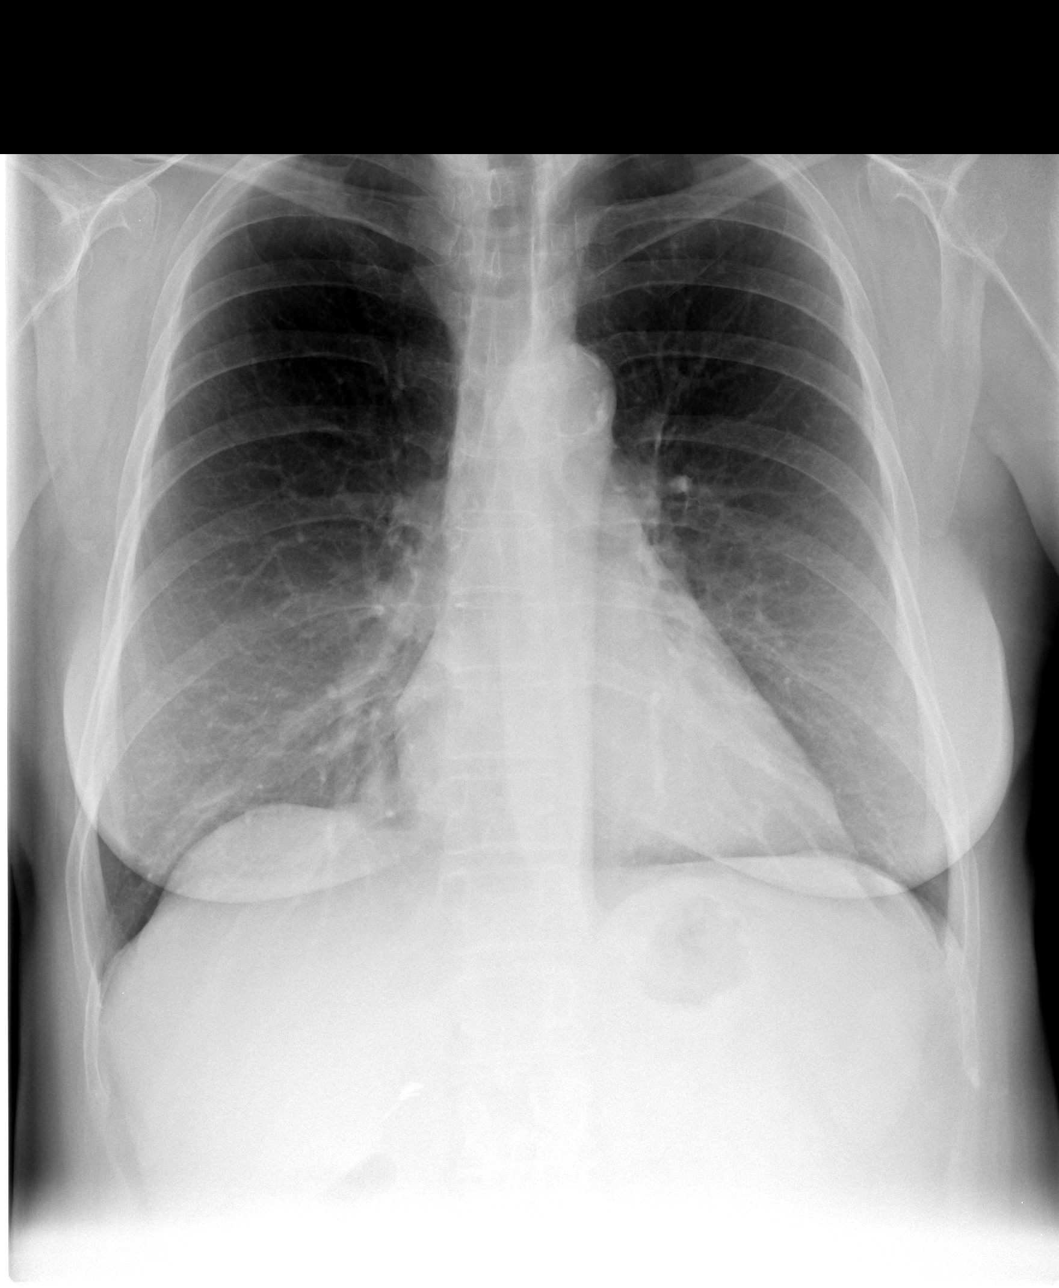

[view not recorded (2 of 3)]
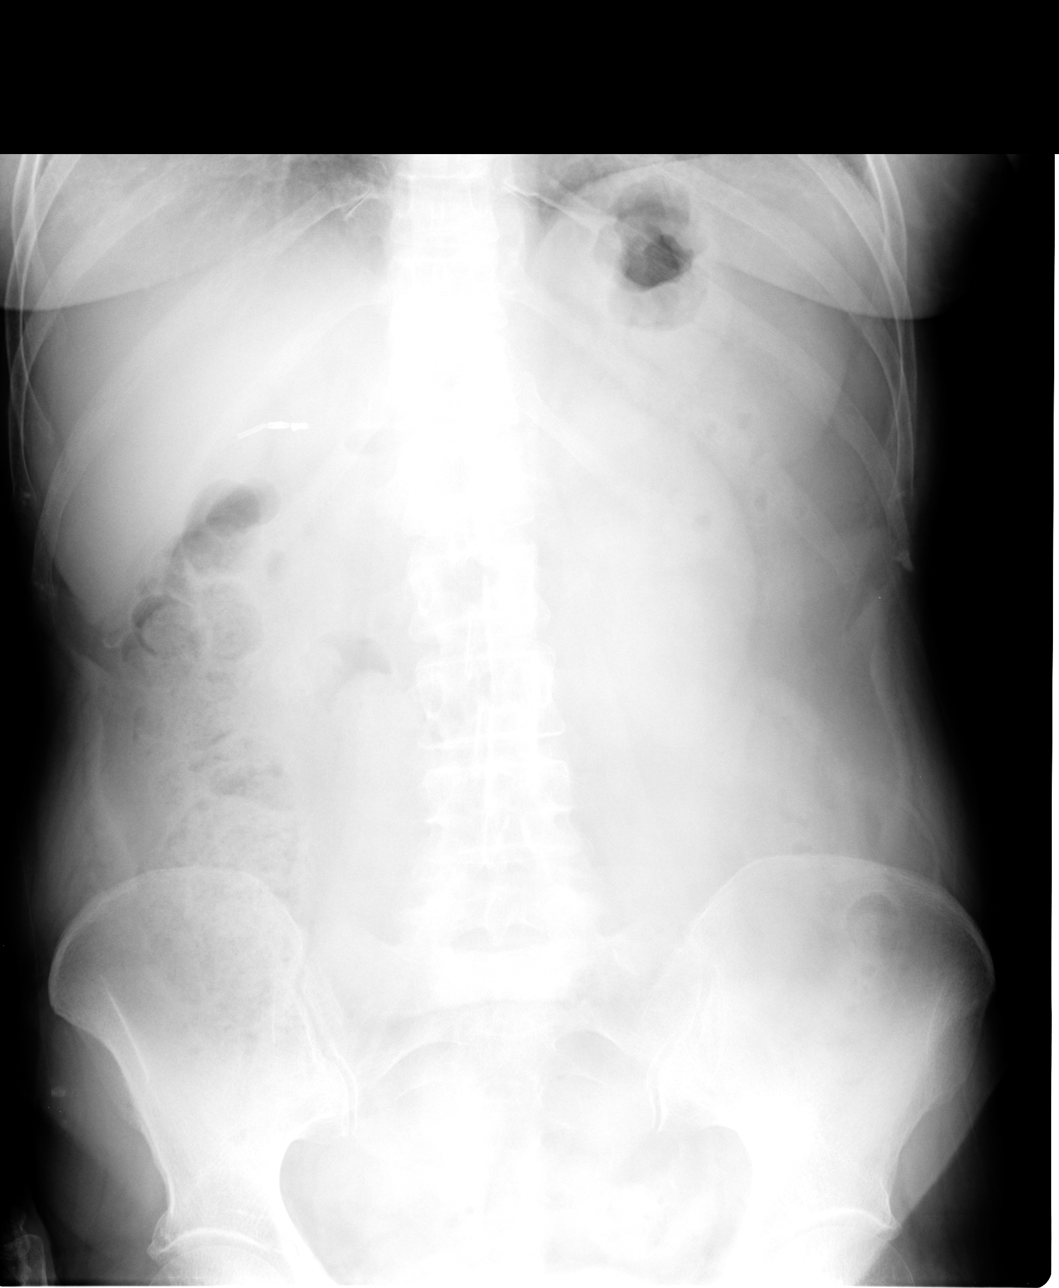

[view not recorded (3 of 3)]
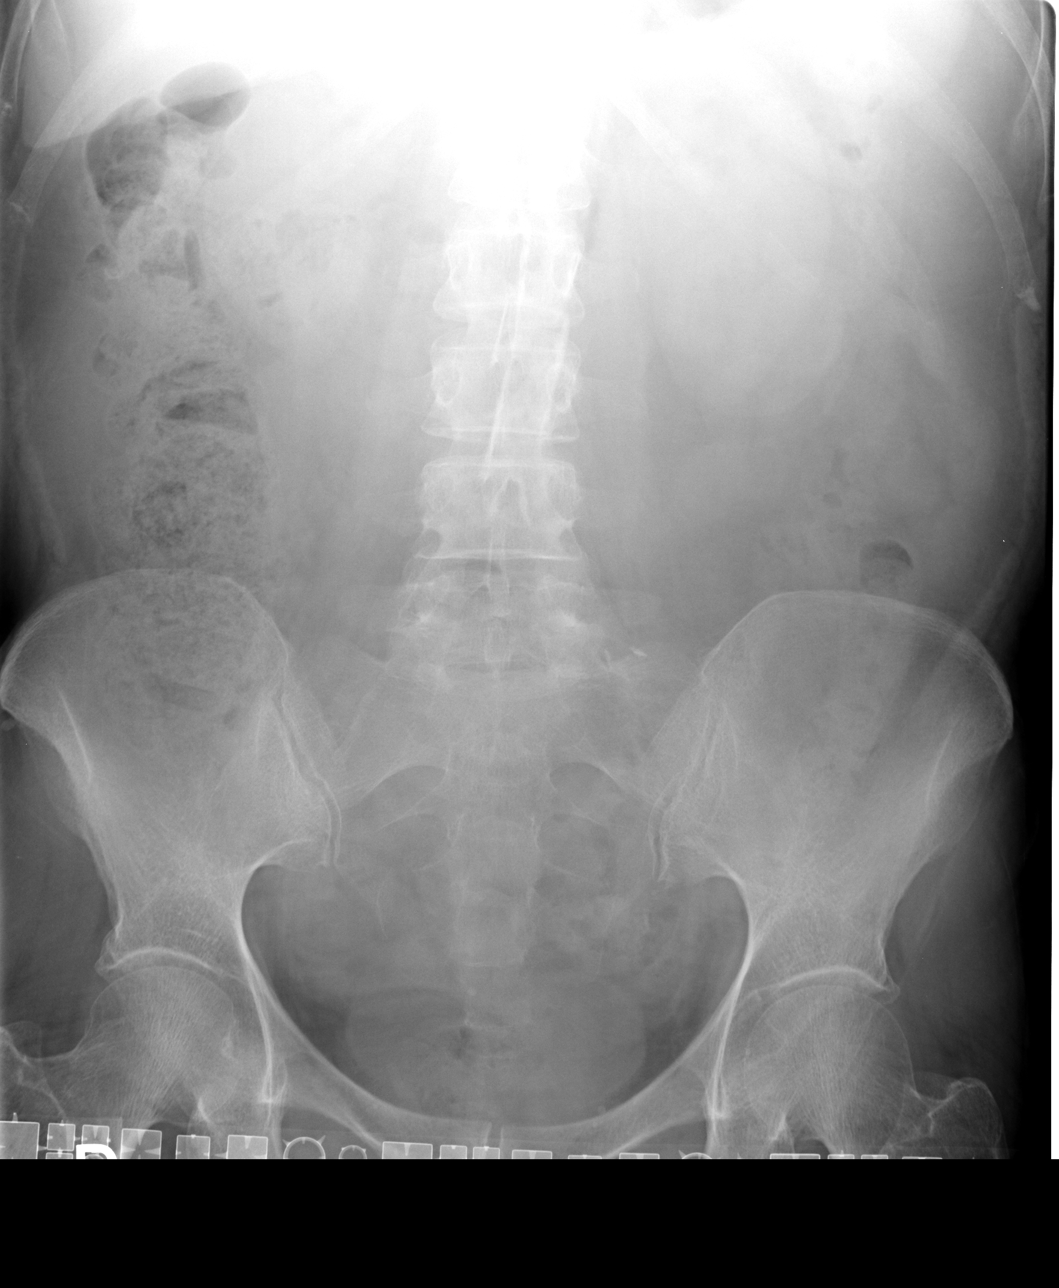

[3 of 3 positions shown; findings below may reference images not displayed]

FINDINGS: Unchanged cardiac silhouette and mediastinal contours with
atherosclerotic calcifications within the thoracic aorta. Overall
improved aeration of the bilateral lung bases. Ill-defined
heterogeneous possible nodular opacities are seen within medial
aspect of the right lower lung.  No definite pleural effusion or
pneumothorax.

Moderate colonic stool burden without definite evidence of
obstruction.  No pneumoperitoneum, pneumatosis or portal venous
gas.  No definite abnormal intra-abdominal calcifications.  Post
cholecystectomy.

Mild scoliotic curvature of the thoracolumbar spine.
IMPRESSION: 1.  Ill-defined heterogeneous possible nodular opacities are seen
with the medial aspect of the right lower lung.  While possibly
representing atelectasis, these opacities were not seen on prior
examinations.  As such, a follow-up chest radiograph in 4 to 6
weeks is recommended to ensure resolution.

 2.  Moderate colonic stool burden without evidence of obstruction.

These results will be called to the ordering clinician or
representative by the Radiologist Assistant, and communication
documented in the PACS Dashboard.

## 2014-07-21 ENCOUNTER — Emergency Department (HOSPITAL_COMMUNITY)
Admission: EM | Admit: 2014-07-21 | Discharge: 2014-07-21 | Discharge status: Against Medical Advice | Payer: Medicare Other | Attending: Emergency Medicine | Admitting: Emergency Medicine

## 2014-07-21 ENCOUNTER — Encounter (HOSPITAL_COMMUNITY): Payer: Self-pay | Admitting: *Deleted

## 2014-07-21 DIAGNOSIS — I10 Essential (primary) hypertension: Secondary | ICD-10-CM | POA: Insufficient documentation

## 2014-07-21 DIAGNOSIS — J441 Chronic obstructive pulmonary disease with (acute) exacerbation: Secondary | ICD-10-CM | POA: Insufficient documentation

## 2014-07-21 DIAGNOSIS — Z8719 Personal history of other diseases of the digestive system: Secondary | ICD-10-CM | POA: Insufficient documentation

## 2014-07-21 DIAGNOSIS — Z720 Tobacco use: Secondary | ICD-10-CM | POA: Diagnosis not present

## 2014-07-21 DIAGNOSIS — Z7982 Long term (current) use of aspirin: Secondary | ICD-10-CM | POA: Insufficient documentation

## 2014-07-21 DIAGNOSIS — Z79899 Other long term (current) drug therapy: Secondary | ICD-10-CM | POA: Diagnosis not present

## 2014-07-21 DIAGNOSIS — M545 Low back pain: Secondary | ICD-10-CM | POA: Insufficient documentation

## 2014-07-21 DIAGNOSIS — G8929 Other chronic pain: Secondary | ICD-10-CM | POA: Diagnosis not present

## 2014-07-21 DIAGNOSIS — G43909 Migraine, unspecified, not intractable, without status migrainosus: Secondary | ICD-10-CM | POA: Insufficient documentation

## 2014-07-21 DIAGNOSIS — F329 Major depressive disorder, single episode, unspecified: Secondary | ICD-10-CM | POA: Diagnosis not present

## 2014-07-21 DIAGNOSIS — M549 Dorsalgia, unspecified: Secondary | ICD-10-CM

## 2014-07-21 MED ORDER — OXYCODONE-ACETAMINOPHEN 5-325 MG PO TABS
1.0000 | ORAL_TABLET | Freq: Once | ORAL | Status: AC
  Administered 2014-07-21: 1 via ORAL
  Filled 2014-07-21: qty 1

## 2014-07-21 NOTE — ED Provider Notes (Signed)
CSN: [id number]     Arrival date & time 07/21/14  2159 History   First MD Initiated Contact with Patient 07/21/14 2212     Chief Complaint  Patient presents with  . Back Pain     (Consider location/radiation/quality/duration/timing/severity/associated sxs/prior Treatment) Patient is a 59 y.o. female presenting with back pain. The history is provided by the patient.  Back Pain Location:  Lumbar spine Quality:  Aching and shooting Radiates to:  Does not radiate Pain severity:  Severe Pain is:  Same all the time Timing:  Constant Progression:  Worsening Chronicity:  Chronic Relieved by:  Nothing Worsened by:  Movement, bending and ambulation Ineffective treatments:  Narcotics  ARTIA SINGLEY is a 59 y.o. female who presents to the ED with low back pain. She states she has had chronic low back pain for years. Dr. Gerarda Fraction treats her pain with Norco 10/325 mg. She reports that occasionally she will have an episode where she needs something else. Over the past 2 days her pain has not stopped with taking her regular medication. Tonight the pain got worse so she decided to come to the ED. Patient asking for something for pain prior to having exam done due to the pain.   Past Medical History  Diagnosis Date  . Hypertension   . Chronic back pain   . Depression   . COPD (chronic obstructive pulmonary disease)   . Shortness of breath     with exertion  . Migraine     last migraine 11/07/11  . GERD (gastroesophageal reflux disease)   . Immature cataract    Past Surgical History  Procedure Laterality Date  . Cholecystectomy    . Colonoscopy  11/08/2011    Procedure: COLONOSCOPY;  Surgeon: Rogene Houston, MD;  Location: [facility];  Service: Endoscopy;  Laterality: N/A;  12:15  . Cataract extraction w/phaco Right 01/18/2013    Procedure: CATARACT EXTRACTION PHACO AND INTRAOCULAR LENS PLACEMENT (IOC);  Surgeon: Tonny Branch, MD;  Location: [facility];  Service: Ophthalmology;  Laterality:  Right;  CDE:12.85  . Cataract extraction w/phaco Left 01/28/2013    Procedure: CATARACT EXTRACTION PHACO AND INTRAOCULAR LENS PLACEMENT (IOC);  Surgeon: Tonny Branch, MD;  Location: [facility];  Service: Ophthalmology;  Laterality: Left;  CDE:7.93   No family history on file. History  Substance Use Topics  . Smoking status: Current Every Day Smoker -- 1.00 packs/day for 40 years    Types: Cigarettes  . Smokeless tobacco: Current User     Comment: 1 pack a day  . Alcohol Use: No   OB History    No data available     Review of Systems  Musculoskeletal: Positive for back pain.  all other systems negative    Allergies  Review of patient's allergies indicates no known allergies.  Home Medications   Prior to Admission medications   Medication Sig Start Date End Date Taking? Authorizing Provider  alprazolam Duanne Moron) 2 MG tablet Take 2 mg by mouth 4 (four) times daily. Anxiety   Yes Historical Provider, MD  amitriptyline (ELAVIL) 50 MG tablet Take 150 mg by mouth at bedtime.    Yes Historical Provider, MD  aspirin EC 81 MG tablet Take 81 mg by mouth daily.   Yes Historical Provider, MD  enalapril (VASOTEC) 10 MG tablet  06/30/14  Yes Historical Provider, MD  furosemide (LASIX) 20 MG tablet  07/05/14  Yes Historical Provider, MD  hydrochlorothiazide (HYDRODIURIL) 25 MG tablet Take 25 mg by mouth daily.  Yes Historical Provider, MD  HYDROcodone-acetaminophen (NORCO) 10-325 MG per tablet Take 1 tablet by mouth every 6 (six) hours as needed for pain.   Yes Historical Provider, MD  Omega-3 Fatty Acids (FISH OIL) 1000 MG CAPS Take 3,000 mg by mouth daily.   Yes Historical Provider, MD  rizatriptan (MAXALT) 10 MG tablet Take 10 mg by mouth as needed. May repeat in 2 hours if needed   Yes Historical Provider, MD   BP 112/68 mmHg  Pulse 102  Temp(Src) 98.2 F (36.8 C) (Oral)  Resp 18  Ht 5\' 2"  (1.575 m)  Wt 142 lb (64.411 kg)  BMI 25.97 kg/m2  SpO2 100% Physical Exam  Constitutional: She is  oriented to person, place, and time. She appears well-developed and well-nourished. No distress.  HENT:  Head: Normocephalic and atraumatic.  Nose: Nose normal.  Eyes: EOM are normal.  Neck: Normal range of motion. Neck supple.  Cardiovascular: Normal rate and regular rhythm.   Pulmonary/Chest: Effort normal. She has no wheezes. She has no rales.  Abdominal: Soft. Bowel sounds are normal. There is no tenderness.  Musculoskeletal: Normal range of motion.  Neurological: She is alert and oriented to person, place, and time.  Skin: Skin is warm and dry.  Psychiatric: She has a normal mood and affect. Her behavior is normal.  Nursing note and vitals reviewed.   ED Course  Procedures  @ 1048 pm I went in to complete the exam and see if the pain medication was helping and patient was gone. Left without exam being completed and without instructions.  MDM   Final diagnoses:  Chronic back pain       Ashley Murrain, NP 07/21/14 2251  Jola Schmidt, MD 07/21/14 2306

## 2014-07-21 NOTE — ED Notes (Signed)
Asked provider if patient could have an x-ray of her back, per pt request.

## 2014-07-21 NOTE — ED Notes (Signed)
Pt reports back pain that has become worse & her medications not working. Pt denies any new activity or injury.

## 2014-07-21 NOTE — ED Notes (Signed)
Pt and family member walked out of department w/o speaking to anyone. Pt ambulated w/ no gait issues.

## 2014-08-23 ENCOUNTER — Other Ambulatory Visit (HOSPITAL_COMMUNITY): Payer: Self-pay | Admitting: Internal Medicine

## 2014-08-23 DIAGNOSIS — R51 Headache: Principal | ICD-10-CM

## 2014-08-23 DIAGNOSIS — R55 Syncope and collapse: Secondary | ICD-10-CM

## 2014-08-23 DIAGNOSIS — R519 Headache, unspecified: Secondary | ICD-10-CM

## 2014-08-26 ENCOUNTER — Ambulatory Visit (HOSPITAL_COMMUNITY)
Admission: RE | Admit: 2014-08-26 | Discharge: 2014-08-26 | Disposition: A | Discharge status: Home / Self Care | Payer: Medicare Other | Source: Ambulatory Visit | Attending: Internal Medicine | Admitting: Internal Medicine

## 2014-08-26 DIAGNOSIS — R519 Headache, unspecified: Secondary | ICD-10-CM

## 2014-08-26 DIAGNOSIS — R51 Headache: Secondary | ICD-10-CM | POA: Insufficient documentation

## 2014-08-26 DIAGNOSIS — R55 Syncope and collapse: Secondary | ICD-10-CM | POA: Insufficient documentation

## 2014-08-26 DIAGNOSIS — R42 Dizziness and giddiness: Secondary | ICD-10-CM | POA: Insufficient documentation

## 2014-09-23 ENCOUNTER — Other Ambulatory Visit (HOSPITAL_COMMUNITY): Payer: Self-pay | Admitting: Internal Medicine

## 2014-09-23 DIAGNOSIS — K219 Gastro-esophageal reflux disease without esophagitis: Secondary | ICD-10-CM

## 2014-09-23 DIAGNOSIS — R1310 Dysphagia, unspecified: Secondary | ICD-10-CM

## 2014-09-23 DIAGNOSIS — R490 Dysphonia: Secondary | ICD-10-CM

## 2014-09-30 ENCOUNTER — Other Ambulatory Visit (HOSPITAL_COMMUNITY): Payer: Medicare Other

## 2015-08-28 ENCOUNTER — Emergency Department (HOSPITAL_COMMUNITY)
Admission: EM | Admit: 2015-08-28 | Discharge: 2015-08-28 | Disposition: A | Discharge status: Home / Self Care | Payer: Medicare Other | Attending: Emergency Medicine | Admitting: Emergency Medicine

## 2015-08-28 ENCOUNTER — Emergency Department (HOSPITAL_COMMUNITY): Payer: Medicare Other

## 2015-08-28 ENCOUNTER — Encounter (HOSPITAL_COMMUNITY): Payer: Self-pay | Admitting: *Deleted

## 2015-08-28 DIAGNOSIS — J449 Chronic obstructive pulmonary disease, unspecified: Secondary | ICD-10-CM | POA: Insufficient documentation

## 2015-08-28 DIAGNOSIS — Z79899 Other long term (current) drug therapy: Secondary | ICD-10-CM | POA: Diagnosis not present

## 2015-08-28 DIAGNOSIS — Y999 Unspecified external cause status: Secondary | ICD-10-CM | POA: Insufficient documentation

## 2015-08-28 DIAGNOSIS — I10 Essential (primary) hypertension: Secondary | ICD-10-CM | POA: Insufficient documentation

## 2015-08-28 DIAGNOSIS — S161XXA Strain of muscle, fascia and tendon at neck level, initial encounter: Secondary | ICD-10-CM | POA: Insufficient documentation

## 2015-08-28 DIAGNOSIS — Y939 Activity, unspecified: Secondary | ICD-10-CM | POA: Diagnosis not present

## 2015-08-28 DIAGNOSIS — S199XXA Unspecified injury of neck, initial encounter: Secondary | ICD-10-CM | POA: Diagnosis present

## 2015-08-28 DIAGNOSIS — F329 Major depressive disorder, single episode, unspecified: Secondary | ICD-10-CM | POA: Insufficient documentation

## 2015-08-28 DIAGNOSIS — F17210 Nicotine dependence, cigarettes, uncomplicated: Secondary | ICD-10-CM | POA: Insufficient documentation

## 2015-08-28 DIAGNOSIS — Y929 Unspecified place or not applicable: Secondary | ICD-10-CM | POA: Diagnosis not present

## 2015-08-28 MED ORDER — METHOCARBAMOL 500 MG PO TABS: 500.0000 mg | ORAL_TABLET | Freq: Four times a day (QID) | ORAL | Status: DC

## 2015-08-28 MED ORDER — DICLOFENAC SODIUM 50 MG PO TBEC: 50.0000 mg | DELAYED_RELEASE_TABLET | Freq: Two times a day (BID) | ORAL | Status: DC
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
## 2015-08-28 NOTE — Discharge Instructions (Signed)
Cervical Sprain  A cervical sprain is an injury in the neck in which the strong, fibrous tissues (ligaments) that connect your neck bones stretch or tear. Cervical sprains can range from mild to severe. Severe cervical sprains can cause the neck vertebrae to be unstable. This can lead to damage of the spinal cord and can result in serious nervous system problems. The amount of time it takes for a cervical sprain to get better depends on the cause and extent of the injury. Most cervical sprains heal in 1 to 3 weeks.  CAUSES   Severe cervical sprains may be caused by:    Contact sport injuries (such as from football, rugby, wrestling, hockey, auto racing, gymnastics, diving, martial arts, or boxing).    Motor vehicle collisions.    Whiplash injuries. This is an injury from a sudden forward and backward whipping movement of the head and neck.   Falls.   Mild cervical sprains may be caused by:    Being in an awkward position, such as while cradling a telephone between your ear and shoulder.    Sitting in a chair that does not offer proper support.    Working at a poorly designed computer station.    Looking up or down for long periods of time.   SYMPTOMS    Pain, soreness, stiffness, or a burning sensation in the front, back, or sides of the neck. This discomfort may develop immediately after the injury or slowly, 24 hours or more after the injury.    Pain or tenderness directly in the middle of the back of the neck.    Shoulder or upper back pain.    Limited ability to move the neck.    Headache.    Dizziness.    Weakness, numbness, or tingling in the hands or arms.    Muscle spasms.    Difficulty swallowing or chewing.    Tenderness and swelling of the neck.   DIAGNOSIS   Most of the time your health care provider can diagnose a cervical sprain by taking your history and doing a physical exam. Your health care provider will ask about previous neck injuries and any known neck  problems, such as arthritis in the neck. X-rays may be taken to find out if there are any other problems, such as with the bones of the neck. Other tests, such as a CT scan or MRI, may also be needed.   TREATMENT   Treatment depends on the severity of the cervical sprain. Mild sprains can be treated with rest, keeping the neck in place (immobilization), and pain medicines. Severe cervical sprains are immediately immobilized. Further treatment is done to help with pain, muscle spasms, and other symptoms and may include:   Medicines, such as pain relievers, numbing medicines, or muscle relaxants.    Physical therapy. This may involve stretching exercises, strengthening exercises, and posture training. Exercises and improved posture can help stabilize the neck, strengthen muscles, and help stop symptoms from returning.   HOME CARE INSTRUCTIONS    Put ice on the injured area.     Put ice in a plastic bag.     Place a towel between your skin and the bag.     Leave the ice on for 15-20 minutes, 3-4 times a day.    If your injury was severe, you may have been given a cervical collar to wear. A cervical collar is a two-piece collar designed to keep your neck from moving while it heals.      Do not remove the collar unless instructed by your health care provider.    If you have long hair, keep it outside of the collar.    Ask your health care provider before making any adjustments to your collar. Minor adjustments may be required over time to improve comfort and reduce pressure on your chin or on the back of your head.    Ifyou are allowed to remove the collar for cleaning or bathing, follow your health care provider's instructions on how to do so safely.    Keep your collar clean by wiping it with mild soap and water and drying it completely. If the collar you have been given includes removable pads, remove them every 1-2 days and hand wash them with soap and water. Allow them to air dry. They should be completely  dry before you wear them in the collar.    If you are allowed to remove the collar for cleaning and bathing, wash and dry the skin of your neck. Check your skin for irritation or sores. If you see any, tell your health care provider.    Do not drive while wearing the collar.    Only take over-the-counter or prescription medicines for pain, discomfort, or fever as directed by your health care provider.    Keep all follow-up appointments as directed by your health care provider.    Keep all physical therapy appointments as directed by your health care provider.    Make any needed adjustments to your workstation to promote good posture.    Avoid positions and activities that make your symptoms worse.    Warm up and stretch before being active to help prevent problems.   SEEK MEDICAL CARE IF:    Your pain is not controlled with medicine.    You are unable to decrease your pain medicine over time as planned.    Your activity level is not improving as expected.   SEEK IMMEDIATE MEDICAL CARE IF:    You develop any bleeding.   You develop stomach upset.   You have signs of an allergic reaction to your medicine.    Your symptoms get worse.    You develop new, unexplained symptoms.    You have numbness, tingling, weakness, or paralysis in any part of your body.   MAKE SURE YOU:    Understand these instructions.   Will watch your condition.   Will get help right away if you are not doing well or get worse.     This information is not intended to replace advice given to you by your health care provider. Make sure you discuss any questions you have with your health care provider.     Document Released: 01/06/2007 Document Revised: 03/16/2013 Document Reviewed: 09/16/2012  Elsevier Interactive Patient Education 2016 Elsevier Inc[employer]

## 2016-01-08 ENCOUNTER — Other Ambulatory Visit (HOSPITAL_COMMUNITY): Payer: Self-pay | Admitting: Internal Medicine

## 2016-01-08 DIAGNOSIS — R109 Unspecified abdominal pain: Secondary | ICD-10-CM

## 2016-01-17 ENCOUNTER — Ambulatory Visit (HOSPITAL_COMMUNITY)
Admission: RE | Admit: 2016-01-17 | Discharge: 2016-01-17 | Disposition: A | Discharge status: Home / Self Care | Payer: Medicare Other | Source: Ambulatory Visit | Attending: Internal Medicine | Admitting: Internal Medicine

## 2016-01-17 DIAGNOSIS — R102 Pelvic and perineal pain: Secondary | ICD-10-CM | POA: Insufficient documentation

## 2016-01-17 DIAGNOSIS — R109 Unspecified abdominal pain: Secondary | ICD-10-CM

## 2016-01-17 DIAGNOSIS — Z1389 Encounter for screening for other disorder: Secondary | ICD-10-CM | POA: Insufficient documentation

## 2016-01-17 DIAGNOSIS — R1031 Right lower quadrant pain: Secondary | ICD-10-CM | POA: Insufficient documentation

## 2016-01-17 DIAGNOSIS — I700 Atherosclerosis of aorta: Secondary | ICD-10-CM | POA: Insufficient documentation

## 2016-01-17 DIAGNOSIS — M4854XA Collapsed vertebra, not elsewhere classified, thoracic region, initial encounter for fracture: Secondary | ICD-10-CM | POA: Diagnosis not present

## 2016-01-17 MED ORDER — IOPAMIDOL (ISOVUE-300) INJECTION 61%
100.0000 mL | Freq: Once | INTRAVENOUS | Status: AC | PRN
  Administered 2016-01-17: 100 mL via INTRAVENOUS

## 2016-02-19 ENCOUNTER — Ambulatory Visit (INDEPENDENT_AMBULATORY_CARE_PROVIDER_SITE_OTHER): Payer: Medicare Other | Admitting: "Endocrinology

## 2016-02-19 ENCOUNTER — Encounter: Payer: Self-pay | Admitting: "Endocrinology

## 2016-02-19 VITALS — BP 142/84 | HR 91 | Ht 61.0 in | Wt 129.0 lb

## 2016-02-19 DIAGNOSIS — E2749 Other adrenocortical insufficiency: Secondary | ICD-10-CM

## 2016-02-19 NOTE — Progress Notes (Signed)
Subjective:    Patient ID: Bonnie Schneider, female    DOB: [birth year], PCP Glo Herring., MD   Past Medical History:  Diagnosis Date  . Chronic back pain   . COPD (chronic obstructive pulmonary disease) ([place])   . Depression   . GERD (gastroesophageal reflux disease)   . Hypertension   . Immature cataract   . Migraine    last migraine 11/07/11  . Shortness of breath    with exertion   Past Surgical History:  Procedure Laterality Date  . CATARACT EXTRACTION W/PHACO Right 01/18/2013   Procedure: CATARACT EXTRACTION PHACO AND INTRAOCULAR LENS PLACEMENT (IOC);  Surgeon: Tonny Branch, MD;  Location: [facility];  Service: Ophthalmology;  Laterality: Right;  CDE:12.85  . CATARACT EXTRACTION W/PHACO Left 01/28/2013   Procedure: CATARACT EXTRACTION PHACO AND INTRAOCULAR LENS PLACEMENT (IOC);  Surgeon: Tonny Branch, MD;  Location: [facility];  Service: Ophthalmology;  Laterality: Left;  CDE:7.93  . CHOLECYSTECTOMY    . COLONOSCOPY  11/08/2011   Procedure: COLONOSCOPY;  Surgeon: Rogene Houston, MD;  Location: [facility];  Service: Endoscopy;  Laterality: N/A;  12:15   Social History   Social History  . Marital status: Divorced    Spouse name: N/A  . Number of children: N/A  . Years of education: N/A   Social History Main Topics  . Smoking status: Current Every Day Smoker    Packs/day: 1.00    Years: 40.00    Types: Cigarettes  . Smokeless tobacco: Current User     Comment: 1 pack a day  . Alcohol use No  . Drug use: No  . Sexual activity: Not Asked   Other Topics Concern  . None   Social History Narrative  . None   Outpatient Encounter Prescriptions as of 02/19/2016  Medication Sig  . alprazolam (XANAX) 2 MG tablet Take 2 mg by mouth 4 (four) times daily. Anxiety  . enalapril (VASOTEC) 10 MG tablet Take 10 mg by mouth daily.   Marland Kitchen HYDROcodone-acetaminophen (NORCO) 10-325 MG per tablet Take 1 tablet by mouth every 6 (six) hours as needed for pain.  . rizatriptan (MAXALT) 10  MG tablet Take 10 mg by mouth as needed. May repeat in 2 hours if needed  . traZODone (DESYREL) 100 MG tablet Take 100 mg by mouth at bedtime.  Marland Kitchen amitriptyline (ELAVIL) 50 MG tablet Take 150 mg by mouth at bedtime.   . diclofenac (VOLTAREN) 50 MG EC tablet Take 1 tablet (50 mg total) by mouth 2 (two) times daily. (Patient not taking: Reported on 02/19/2016)  . furosemide (LASIX) 20 MG tablet Take 20 mg by mouth daily as needed for fluid.   . hydrochlorothiazide (HYDRODIURIL) 25 MG tablet Take 25 mg by mouth daily.  . methocarbamol (ROBAXIN) 500 MG tablet Take 1 tablet (500 mg total) by mouth 4 (four) times daily. (Patient not taking: Reported on 02/19/2016)  . Omega-3 Fatty Acids (FISH OIL) 1000 MG CAPS Take 3,000 mg by mouth daily.  . pantoprazole (PROTONIX) 40 MG tablet Take 40 mg by mouth daily.   No facility-administered encounter medications on file as of 02/19/2016.    ALLERGIES: No Known Allergies VACCINATION STATUS:  There is no immunization history on file for this patient.  HPI 60 year old female patient with medical history as above. 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 Gerarda Fraction. - She is a patient with multiple medical problems including advanced COPD resulting from chronic heavy smoking. On 01/08/2016  she was found to have relatively low a.m. cortisol of 4.8 (normal 6.2-19.4). Her p.m. cortisol was normal at 8.5 (normal 2. 3-11.9). On further interview she reports that over the last 2-3 months she lost 10 pounds, associated with poor appetite. She has a 50-pack-year history of smoking. She is on hydrocortisone for chronic pain. - She denies prior history of adrenal dysfunction. She has no family history of adrenal, pituitary, parathyroid problems. - She was exposed to some oral and inhaled steroids related to her COPD. She is not on any oral steroids currently.  Review of Systems  Objective:    BP (!) 142/84   Pulse 91   Ht 5\' 1"  (1.549 m)    Wt 129 lb (58.5 kg)   BMI 24.37 kg/m   Wt Readings from Last 3 Encounters:  02/19/16 129 lb (58.5 kg)  08/28/15 138 lb (62.6 kg)  07/21/14 142 lb (64.4 kg)    Physical Exam  CMP ( most recent) CMP     Component Value Date/Time   NA 139 01/12/2013 1255   K 4.2 01/12/2013 1255   CL 102 01/12/2013 1255   CO2 22 01/12/2013 1255   GLUCOSE 82 01/12/2013 1255   BUN 19 01/12/2013 1255   CREATININE 0.92 01/12/2013 1255   CALCIUM 9.4 01/12/2013 1255   GFRNONAA 68 (L) 01/12/2013 1255   GFRAA 79 (L) 01/12/2013 1255   01/08/2016 a.m. cortisol 4.8, p.m. cortisol 8.5.   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 She does have significant history putting her at risk for adrenal insufficiency either from steroids or opioids suppression of hypothalamic/pituitary/adrenal axis. - She is none specifically symptomatic. She needs a confirmatory test for adrenal insufficiency. I will proceed with ACTH stimulation test and we will reevaluate her in 10 days. - Her major health risk is her smoking habit with COPD which will only get worse from this point. I have extensively advised her on smoking cessation.  - I advised patient to maintain close follow up with Glo Herring., MD for primary care needs. Follow up plan: Return in about 10 days (around 02/29/2016) for follow up with pre-visit labs.  Glade Lloyd, MD Phone: [phone]  Fax: [phone]   02/19/2016, 11:33 AM

## 2016-02-22 ENCOUNTER — Encounter (HOSPITAL_COMMUNITY)
Admission: RE | Admit: 2016-02-22 | Discharge: 2016-02-22 | Disposition: A | Discharge status: Home / Self Care | Payer: Medicare Other | Source: Ambulatory Visit | Attending: "Endocrinology | Admitting: "Endocrinology

## 2016-02-22 ENCOUNTER — Encounter (HOSPITAL_COMMUNITY): Payer: Self-pay

## 2016-02-22 DIAGNOSIS — E2749 Other adrenocortical insufficiency: Secondary | ICD-10-CM | POA: Insufficient documentation

## 2016-02-22 LAB — ACTH STIMULATION, 3 TIME POINTS
CORTISOL 60 MIN: 23.3 ug/dL
CORTISOL BASE: 7.4 ug/dL
Cortisol, 30 Min: 21.3 ug/dL

## 2016-02-22 MED ORDER — COSYNTROPIN 0.25 MG IJ SOLR
INTRAMUSCULAR | Status: AC
  Filled 2016-02-22: qty 0.25

## 2016-02-22 MED ORDER — COSYNTROPIN 0.25 MG IJ SOLR
0.2500 mg | Freq: Once | INTRAMUSCULAR | Status: AC
  Administered 2016-02-22: 0.25 mg via INTRAVENOUS

## 2016-02-22 NOTE — Progress Notes (Signed)
Results for WRENN, STOURS (MRN [id number]) as of 02/22/2016 14:16 Tolerated test well.   Ref. Range 02/22/2016 09:00  Cortisol, Base Latest Units: ug/dL 7.4  Cortisol, 30 Min Latest Units: ug/dL 21.3  Cortisol, 60 Min Latest Units: ug/dL 23.3

## 2016-02-22 NOTE — Discharge Instructions (Signed)
ACTH Stimulation Test Why am I having this test? The adrenocorticotropic hormone (ACTH) stimulation test indirectly shows how well your adrenal glands are working. ACTH is a hormone that is produced by a gland in your brain called the pituitary gland. ACTH stimulates your two adrenal glands, which are located above each kidney. The adrenal glands produce hormones that are released into the blood. One of these hormones is cortisol. Cortisol helps your body to respond to stress. If your adrenal glands are not responding to [place] properly, you may have too much or too little cortisol. What kind of sample is taken? Two or more blood samples are required for this test. Blood samples are usually collected by inserting a needle into a vein. Cortisol will be measured in the first blood sample to provide a baseline level. After the first blood sample has been collected, you will be given cosyntropin. Cosyntropin is similar to ACTH and should cause the adrenal glands to release cortisol. Cosyntropin is usually given through an IV tube. It could also be given as an intramuscular (IM) injection. At specified intervals after receiving the cosyntropin, you will have one or more blood samples taken to measure your cortisol levels. The test results will be compared to show the amount of cortisol in your blood before and after you were given cosyntropin. How do I prepare for this test? Do not eat or drink anything after midnight on the night before the test or as directed by your health care provider. What are the reference values? Reference values are considered healthy values established after testing a large group of healthy people. Reference values may vary among different people, labs, and hospitals. It is your responsibility to obtain your test results. Ask the lab or department performing the test when and how you will get your results. The following are reference values for the various ACTH stimulation  tests:  Rapid test: cortisol levels increase greater than 7 mg/dL above baseline.  24-hour test: cortisol levels greater than 40 mcg/dL.  3-day test: cortisol levels greater than 40 mcg/dL. What do the results mean? Results outside of the reference value may indicate:  Cushing syndrome.  Adrenal insufficiency. Talk with your health care provider to discuss your results, treatment options, and if necessary, the need for more tests. Talk with your health care provider if you have any questions about your results. Talk with your health care provider to discuss your results, treatment options, and if necessary, the need for more tests. Talk with your health care provider if you have any questions about your results. This information is not intended to replace advice given to you by your health care provider. Make sure you discuss any questions you have with your health care provider. Document Released: 04/13/2010 Document Revised: 11/13/2015 Document Reviewed: 10/20/2013 Elsevier Interactive Patient Education  2017 [employer].

## 2016-02-23 ENCOUNTER — Other Ambulatory Visit (HOSPITAL_COMMUNITY): Payer: Medicare Other

## 2016-02-23 ENCOUNTER — Encounter (HOSPITAL_COMMUNITY): Payer: Medicare Other

## 2016-03-04 ENCOUNTER — Encounter: Payer: Self-pay | Admitting: "Endocrinology

## 2016-03-04 ENCOUNTER — Ambulatory Visit (INDEPENDENT_AMBULATORY_CARE_PROVIDER_SITE_OTHER): Payer: Medicare Other | Admitting: "Endocrinology

## 2016-03-04 VITALS — BP 161/95 | HR 94 | Ht 61.0 in | Wt 133.0 lb

## 2016-03-04 DIAGNOSIS — E2749 Other adrenocortical insufficiency: Secondary | ICD-10-CM | POA: Diagnosis not present

## 2016-03-04 MED ORDER — ENALAPRIL MALEATE 20 MG PO TABS: 20.0000 mg | ORAL_TABLET | Freq: Every day | ORAL | 2 refills | Status: DC

## 2016-03-04 NOTE — Progress Notes (Signed)
Subjective:    Patient ID: Bonnie Schneider, female    DOB: [birth year], PCP Bonnie Schneider., MD   Past Medical History:  Diagnosis Date  . Chronic back pain   . COPD (chronic obstructive pulmonary disease) ([place])   . Depression   . GERD (gastroesophageal reflux disease)   . Hypertension   . Immature cataract   . Migraine    last migraine 11/07/11  . Shortness of breath    with exertion   Past Surgical History:  Procedure Laterality Date  . CATARACT EXTRACTION W/PHACO Right 01/18/2013   Procedure: CATARACT EXTRACTION PHACO AND INTRAOCULAR LENS PLACEMENT (IOC);  Surgeon: Bonnie Branch, MD;  Location: [facility];  Service: Ophthalmology;  Laterality: Right;  CDE:12.85  . CATARACT EXTRACTION W/PHACO Left 01/28/2013   Procedure: CATARACT EXTRACTION PHACO AND INTRAOCULAR LENS PLACEMENT (IOC);  Surgeon: Bonnie Branch, MD;  Location: [facility];  Service: Ophthalmology;  Laterality: Left;  CDE:7.93  . CHOLECYSTECTOMY    . COLONOSCOPY  11/08/2011   Procedure: COLONOSCOPY;  Surgeon: Bonnie Houston, MD;  Location: [facility];  Service: Endoscopy;  Laterality: N/A;  12:15   Social History   Social History  . Marital status: Divorced    Spouse name: N/A  . Number of children: N/A  . Years of education: N/A   Social History Main Topics  . Smoking status: Current Every Day Smoker    Packs/day: 1.00    Years: 40.00    Types: Cigarettes  . Smokeless tobacco: Current User     Comment: 1 pack a day  . Alcohol use No  . Drug use: No  . Sexual activity: Not Asked   Other Topics Concern  . None   Social History Narrative  . None   Outpatient Encounter Prescriptions as of 03/04/2016  Medication Sig  . alprazolam (XANAX) 2 MG tablet Take 2 mg by mouth 4 (four) times daily. Anxiety  . amitriptyline (ELAVIL) 50 MG tablet Take 150 mg by mouth at bedtime.   . enalapril (VASOTEC) 20 MG tablet Take 1 tablet (20 mg total) by mouth daily.  . furosemide (LASIX) 20 MG tablet Take 20 mg by mouth  daily as needed for fluid.   . hydrochlorothiazide (HYDRODIURIL) 25 MG tablet Take 25 mg by mouth daily.  Marland Kitchen HYDROcodone-acetaminophen (NORCO) 10-325 MG per tablet Take 1 tablet by mouth every 6 (six) hours as needed for pain.  . Omega-3 Fatty Acids (FISH OIL) 1000 MG CAPS Take 3,000 mg by mouth daily.  . pantoprazole (PROTONIX) 40 MG tablet Take 40 mg by mouth daily.  . rizatriptan (MAXALT) 10 MG tablet Take 10 mg by mouth as needed. May repeat in 2 hours if needed  . traZODone (DESYREL) 100 MG tablet Take 100 mg by mouth at bedtime.  . [DISCONTINUED] diclofenac (VOLTAREN) 50 MG EC tablet Take 1 tablet (50 mg total) by mouth 2 (two) times daily. (Patient not taking: Reported on 02/19/2016)  . [DISCONTINUED] enalapril (VASOTEC) 10 MG tablet Take 20 mg by mouth daily.  . [DISCONTINUED] methocarbamol (ROBAXIN) 500 MG tablet Take 1 tablet (500 mg total) by mouth 4 (four) times daily. (Patient not taking: Reported on 02/19/2016)   No facility-administered encounter medications on file as of 03/04/2016.    ALLERGIES: No Known Allergies VACCINATION STATUS:  There is no immunization history on file for this patient.  HPI 60 year old female patient with medical history as above. She is being seen in f/u hypocholesterolemia. - She is a patient with multiple  medical problems including advanced COPD resulting from chronic heavy smoking. On 01/08/2016 she was found to have relatively low a.m. cortisol of 4.8 (normal 6.2-19.4). Her p.m. cortisol was normal at 8.5 (normal 2. 3-11.9). On further interview she reports that over the last 2-3 months she lost 10 pounds, associated with poor appetite. She has a 50-pack-year history of smoking. She is on hydrocodone for chronic pain. 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 - She denies prior history of adrenal dysfunction. She has no family history of adrenal, pituitary, parathyroid problems. - She was exposed  to some oral and inhaled steroids related to her COPD. She is not on any oral steroids currently.  Review of Systems Constitutional: + weight gain, + fatigue, no subjective hyperthermia/hypothermia Eyes: no blurry vision, no xerophthalmia ENT: no sore throat, no nodules palpated in throat, no dysphagia/odynophagia, no hoarseness Cardiovascular: no CP/SOB/palpitations/leg swelling Respiratory: no cough/SOB Gastrointestinal: no N/V/D/C Musculoskeletal: no muscle/joint aches Skin: no rashes Neurological: no tremors/numbness/tingling/dizziness Psychiatric: + depression, +anxiety  Objective:    BP (!) 161/95   Pulse 94   Ht 5\' 1"  (1.549 m)   Wt 133 lb (60.3 kg)   BMI 25.13 kg/m   Wt Readings from Last 3 Encounters:  03/04/16 133 lb (60.3 kg)  02/22/16 129 lb (58.5 kg)  02/19/16 129 lb (58.5 kg)    Physical Exam  Constitutional:  in NAD Eyes: PERRLA, EOMI, no exophthalmos ENT: moist mucous membranes, no thyromegaly, no cervical lymphadenopathy Cardiovascular: RRR, No MRG Respiratory: Poor air entry, diffuse wheezes. Gastrointestinal: abdomen soft, NT, ND, BS+ Musculoskeletal: no deformities, strength intact in all 4 Skin: moist, warm, no rashes Neurological: no tremor with outstretched hands, DTR normal in all 4  CMP     Component Value Date/Time   NA 139 01/12/2013 1255   K 4.2 01/12/2013 1255   CL 102 01/12/2013 1255   CO2 22 01/12/2013 1255   GLUCOSE 82 01/12/2013 1255   BUN 19 01/12/2013 1255   CREATININE 0.92 01/12/2013 1255   CALCIUM 9.4 01/12/2013 1255   GFRNONAA 68 (L) 01/12/2013 1255   GFRAA 79 (L) 01/12/2013 1255   01/08/2016 a.m. cortisol 4.8, p.m. cortisol 8.5.   Results for Bonnie, Schneider (MRN [id number]) as of 03/04/2016 15:25  Ref. Range 02/22/2016 09:00  Cortisol, Base Latest Units: ug/dL 7.4  Cortisol, 30 Min Latest Units: ug/dL 21.3  Cortisol, 60 Min Latest Units: ug/dL 23.3    Assessment & Plan:   1. Hypocortisolemia ([place]) - Her  baseline cortisol was 7.4, 30 minutes cortisol after ACTH was 21.3 and 60 minutes cortisol after ACTH was 23.3. 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 She will not need to steroid support at this time. If her repeat a.m. cortisol is below 5 next visit, she will be considered and benefit from low-dose steroid support. - She is none specifically symptomatic. She has hypoxia with SPO2 of 88-90%- which placed her major role in the way she feels. Her major health risk is her smoking habit with COPD which will only get worse from this point. I have extensively advised her on smoking cessation.  - I advised patient to maintain close follow up with Bonnie Schneider., MD for primary care needs. Follow up plan: Return in about 6 months (around 09/02/2016) for follow up with pre-visit labs.  Glade Lloyd, MD Phone: [phone]  Fax: [phone]  03/04/2016, 3:37 PM

## 2016-03-13 ENCOUNTER — Ambulatory Visit: Payer: Medicare Other | Admitting: Urology

## 2016-04-19 ENCOUNTER — Other Ambulatory Visit (HOSPITAL_COMMUNITY)
Admission: AD | Admit: 2016-04-19 | Discharge: 2016-04-19 | Disposition: A | Discharge status: Home / Self Care | Payer: Medicare Other | Source: Other Acute Inpatient Hospital | Attending: Urology | Admitting: Urology

## 2016-04-19 ENCOUNTER — Ambulatory Visit (INDEPENDENT_AMBULATORY_CARE_PROVIDER_SITE_OTHER): Payer: Medicare Other | Admitting: Urology

## 2016-04-19 DIAGNOSIS — R3121 Asymptomatic microscopic hematuria: Secondary | ICD-10-CM

## 2016-04-19 DIAGNOSIS — R102 Pelvic and perineal pain: Secondary | ICD-10-CM | POA: Diagnosis not present

## 2016-04-19 DIAGNOSIS — R3914 Feeling of incomplete bladder emptying: Secondary | ICD-10-CM | POA: Diagnosis not present

## 2016-04-19 DIAGNOSIS — R350 Frequency of micturition: Secondary | ICD-10-CM | POA: Insufficient documentation

## 2016-04-19 LAB — URINALYSIS, COMPLETE (UACMP) WITH MICROSCOPIC
Bilirubin Urine: NEGATIVE
GLUCOSE, UA: NEGATIVE mg/dL
Ketones, ur: NEGATIVE mg/dL
Nitrite: NEGATIVE
PH: 5 (ref 5.0–8.0)
Protein, ur: NEGATIVE mg/dL
Specific Gravity, Urine: 1.011 (ref 1.005–1.030)

## 2016-04-21 LAB — URINE CULTURE: Culture: NO GROWTH

## 2016-04-26 ENCOUNTER — Telehealth (HOSPITAL_COMMUNITY): Payer: Self-pay | Admitting: Internal Medicine

## 2016-04-26 NOTE — Telephone Encounter (Signed)
04/26/16 I messaged Dr. Jeffie Pollock and he messaged me back saying that he thinks this patient really needs therapy to help with her pelvic pain and her back problems.

## 2016-05-24 ENCOUNTER — Ambulatory Visit: Payer: Medicare Other | Admitting: Urology

## 2016-06-20 ENCOUNTER — Other Ambulatory Visit: Payer: Self-pay | Admitting: "Endocrinology

## 2016-08-02 ENCOUNTER — Other Ambulatory Visit (HOSPITAL_COMMUNITY)
Admission: RE | Admit: 2016-08-02 | Discharge: 2016-08-02 | Disposition: A | Discharge status: Home / Self Care | Payer: Medicare Other | Source: Other Acute Inpatient Hospital | Attending: Urology | Admitting: Urology

## 2016-08-02 ENCOUNTER — Ambulatory Visit: Payer: Medicare Other | Admitting: Urology

## 2016-08-02 DIAGNOSIS — R3121 Asymptomatic microscopic hematuria: Secondary | ICD-10-CM | POA: Diagnosis present

## 2016-08-02 LAB — URINALYSIS, COMPLETE (UACMP) WITH MICROSCOPIC
BILIRUBIN URINE: NEGATIVE
Glucose, UA: NEGATIVE mg/dL
Ketones, ur: NEGATIVE mg/dL
Nitrite: NEGATIVE
Protein, ur: NEGATIVE mg/dL
SPECIFIC GRAVITY, URINE: 1.017 (ref 1.005–1.030)
pH: 5 (ref 5.0–8.0)

## 2016-09-04 ENCOUNTER — Encounter: Payer: Self-pay | Admitting: "Endocrinology

## 2016-09-04 ENCOUNTER — Ambulatory Visit: Payer: Medicare Other | Admitting: "Endocrinology

## 2016-09-15 ENCOUNTER — Other Ambulatory Visit: Payer: Self-pay | Admitting: "Endocrinology

## 2016-10-23 ENCOUNTER — Ambulatory Visit: Payer: Medicare Other | Admitting: Orthopaedic Surgery

## 2016-10-25 ENCOUNTER — Encounter: Payer: Self-pay | Admitting: Orthopaedic Surgery

## 2016-10-31 ENCOUNTER — Ambulatory Visit: Payer: Medicare Other | Admitting: Orthopaedic Surgery

## 2016-11-06 ENCOUNTER — Encounter: Payer: Self-pay | Admitting: Orthopaedic Surgery

## 2016-11-06 ENCOUNTER — Ambulatory Visit (INDEPENDENT_AMBULATORY_CARE_PROVIDER_SITE_OTHER): Payer: Medicare Other | Admitting: Orthopaedic Surgery

## 2016-11-06 ENCOUNTER — Ambulatory Visit (INDEPENDENT_AMBULATORY_CARE_PROVIDER_SITE_OTHER): Payer: Medicare Other

## 2016-11-06 VITALS — BP 143/94 | HR 79 | Temp 97.1°F | Ht 61.0 in | Wt 121.0 lb

## 2016-11-06 DIAGNOSIS — M898X6 Other specified disorders of bone, lower leg: Secondary | ICD-10-CM

## 2016-11-06 DIAGNOSIS — F17210 Nicotine dependence, cigarettes, uncomplicated: Secondary | ICD-10-CM | POA: Diagnosis not present

## 2016-11-06 NOTE — Progress Notes (Signed)
Subjective:    Patient ID: Bonnie Schneider, female    DOB: 1955/07/08, 61 y.o.   MRN: [id number]  HPI She has had a bony prominence of the right lower tibia since she was a teenager.  It has slowly grown in size.  Over the last month it has become tender slightly.  She saw Dr. Gerarda Fraction who asked she come here.  She has no trauma, no redness, no fluctuance, no numbness.  She says she has been used to it for years.  She does not think it has grown in size over the last six months to a year. She has not had x-rays.  She smokes and is not willing to stop.  Review of Systems  HENT: Negative for congestion.   Respiratory: Positive for cough and shortness of breath.   Cardiovascular: Negative for chest pain and leg swelling.  Endocrine: Positive for cold intolerance.  Musculoskeletal: Positive for arthralgias.  Allergic/Immunologic: Positive for environmental allergies.  Neurological: Positive for headaches.   Past Medical History:  Diagnosis Date  . Chronic back pain   . COPD (chronic obstructive pulmonary disease) ([place])   . Depression   . GERD (gastroesophageal reflux disease)   . Hypertension   . Immature cataract   . Migraine    last migraine 11/07/11  . Shortness of breath    with exertion    Past Surgical History:  Procedure Laterality Date  . CATARACT EXTRACTION W/PHACO Right 01/18/2013   Procedure: CATARACT EXTRACTION PHACO AND INTRAOCULAR LENS PLACEMENT (IOC);  Surgeon: Tonny Branch, MD;  Location: [facility];  Service: Ophthalmology;  Laterality: Right;  CDE:12.85  . CATARACT EXTRACTION W/PHACO Left 01/28/2013   Procedure: CATARACT EXTRACTION PHACO AND INTRAOCULAR LENS PLACEMENT (IOC);  Surgeon: Tonny Branch, MD;  Location: [facility];  Service: Ophthalmology;  Laterality: Left;  CDE:7.93  . CHOLECYSTECTOMY    . COLONOSCOPY  11/08/2011   Procedure: COLONOSCOPY;  Surgeon: Rogene Houston, MD;  Location: [facility];  Service: Endoscopy;  Laterality: N/A;  12:15    Current Outpatient  Prescriptions on File Prior to Visit  Medication Sig Dispense Refill  . alprazolam (XANAX) 2 MG tablet Take 2 mg by mouth 4 (four) times daily. Anxiety    . amitriptyline (ELAVIL) 50 MG tablet Take 150 mg by mouth at bedtime.     . enalapril (VASOTEC) 20 MG tablet TAKE 1 TABLET BY MOUTH EVERY DAY 30 tablet 2  . furosemide (LASIX) 20 MG tablet Take 20 mg by mouth daily as needed for fluid.     . hydrochlorothiazide (HYDRODIURIL) 25 MG tablet Take 25 mg by mouth daily.    Marland Kitchen HYDROcodone-acetaminophen (NORCO) 10-325 MG per tablet Take 1 tablet by mouth every 6 (six) hours as needed for pain.    . Omega-3 Fatty Acids (FISH OIL) 1000 MG CAPS Take 3,000 mg by mouth daily.    . pantoprazole (PROTONIX) 40 MG tablet Take 40 mg by mouth daily.    . rizatriptan (MAXALT) 10 MG tablet Take 10 mg by mouth as needed. May repeat in 2 hours if needed    . traZODone (DESYREL) 100 MG tablet Take 100 mg by mouth at bedtime.     No current facility-administered medications on file prior to visit.     Social History   Social History  . Marital status: Divorced    Spouse name: N/A  . Number of children: N/A  . Years of education: N/A   Occupational History  . Not on file.  Social History Main Topics  . Smoking status: Current Every Day Smoker    Packs/day: 1.00    Years: 40.00    Types: Cigarettes  . Smokeless tobacco: Current User     Comment: 1 pack a day  . Alcohol use No  . Drug use: No  . Sexual activity: Not on file   Other Topics Concern  . Not on file   Social History Narrative  . No narrative on file    Family history of hypertension and heart disease.  BP (!) 143/94   Pulse 79   Temp (!) 97.1 F (36.2 C)   Ht 5\' 1"  (1.549 m)   Wt 121 lb (54.9 kg)   BMI 22.86 kg/m       Objective:   Physical Exam  Constitutional: She is oriented to person, place, and time. She appears well-developed and well-nourished.  HENT:  Head: Normocephalic and atraumatic.  Eyes: Pupils are  equal, round, and reactive to light. Conjunctivae and EOM are normal.  Neck: Normal range of motion. Neck supple.  Cardiovascular: Normal rate, regular rhythm and intact distal pulses.   Pulmonary/Chest: Effort normal.  Abdominal: Soft.  Musculoskeletal: She exhibits tenderness (She has a firm, non moveable lesion hard of the right anterior distal tibia, distal third to lower leg, no redness, no fluctance, normal sensation, no color changes.  Other leg negative.).  Neurological: She is alert and oriented to person, place, and time. She displays normal reflexes. No cranial nerve deficit. She exhibits normal muscle tone. Coordination normal.  Skin: Skin is warm and dry.  Psychiatric: She has a normal mood and affect. Her behavior is normal. Judgment and thought content normal.    X-rays were done of the right lower leg, reported separately.      Assessment & Plan:   Encounter Diagnoses  Name Primary?  . Pain of right tibia Yes  . Cigarette nicotine dependence without complication    I would like to get a MRI of the lesion.  It is scheduled for next week.  Return after the MRI.  Call if any problem.  Precautions discussed.   Electronically Signed Sanjuana Kava, MD 8/15/20182:30 PM

## 2016-11-14 ENCOUNTER — Ambulatory Visit (HOSPITAL_COMMUNITY): Payer: Medicare Other

## 2016-12-11 ENCOUNTER — Other Ambulatory Visit: Payer: Self-pay | Admitting: "Endocrinology

## 2016-12-20 ENCOUNTER — Other Ambulatory Visit: Payer: Self-pay | Admitting: "Endocrinology

## 2017-01-20 ENCOUNTER — Other Ambulatory Visit: Payer: Self-pay | Admitting: "Endocrinology

## 2018-03-16 ENCOUNTER — Other Ambulatory Visit (HOSPITAL_COMMUNITY): Payer: Self-pay | Admitting: Internal Medicine

## 2018-03-16 ENCOUNTER — Other Ambulatory Visit: Payer: Self-pay | Admitting: Internal Medicine

## 2018-03-16 DIAGNOSIS — M79605 Pain in left leg: Principal | ICD-10-CM

## 2018-03-16 DIAGNOSIS — M79604 Pain in right leg: Secondary | ICD-10-CM

## 2018-07-27 IMAGING — CT CT ABD-PELV W/ CM
2 of 5 series · 15 of 46 positions shown, 17 images · IV contrast (APPLIED)
Comparison: 09/27/2011

CLINICAL DATA: Lower abdominal scratched a right lower quadrant
pain for 1 month.

EXAM:
CT ABDOMEN AND PELVIS WITH CONTRAST
TECHNIQUE: Multidetector CT imaging of the abdomen and pelvis was performed
using the standard protocol following bolus administration of
intravenous contrast.
CONTRAST:  100mL [id number] IOPAMIDOL ([id number]) INJECTION 61%

[Series 2: axial st · axial · 0.76mm/px · z∈[+1043,+1448]mm · 12 of 93 slices shown, 14 images]
[im 6/93  soft-tissue]
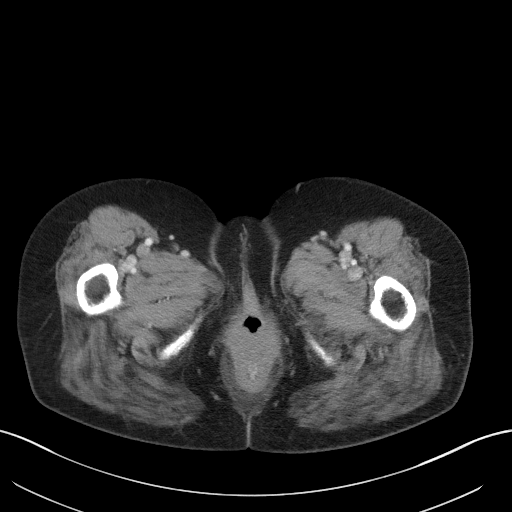
[im 6/93  bone]
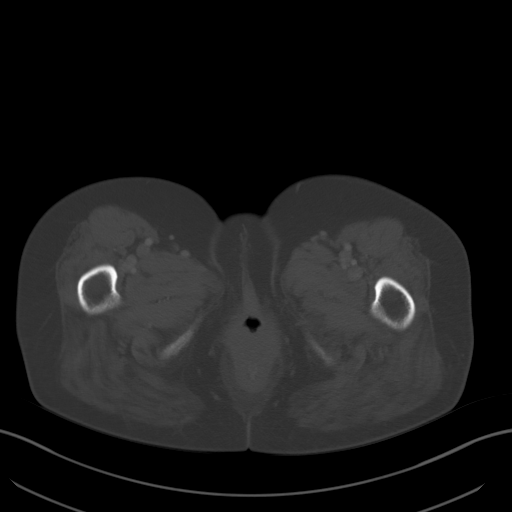
[im 17/93  soft-tissue]
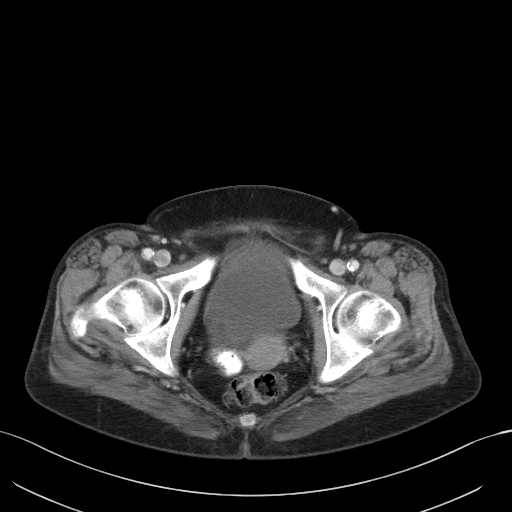
[im 22/93  soft-tissue]
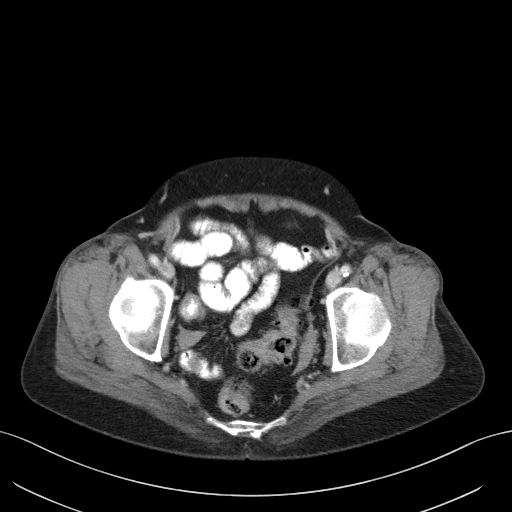
[im 28/93  soft-tissue]
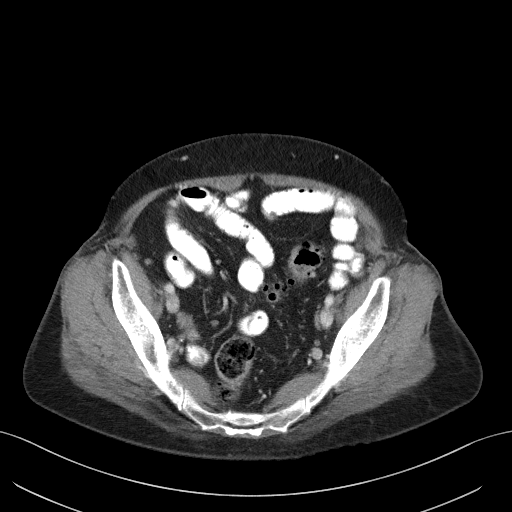
[im 38/93  soft-tissue]
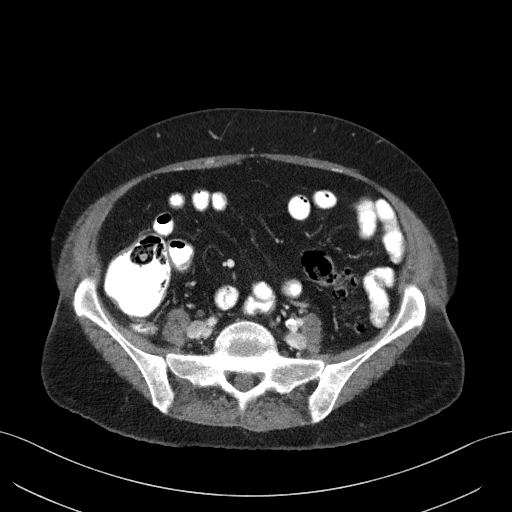
[im 44/93  soft-tissue]
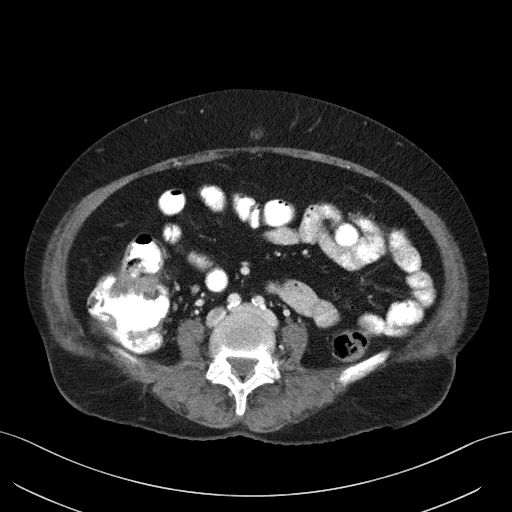
[im 49/93  soft-tissue]
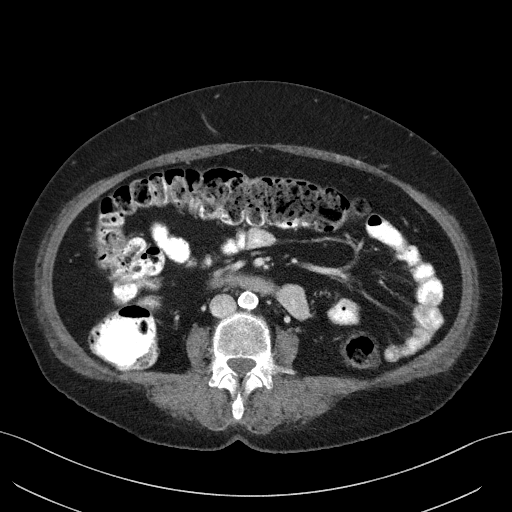
[im 60/93  soft-tissue]
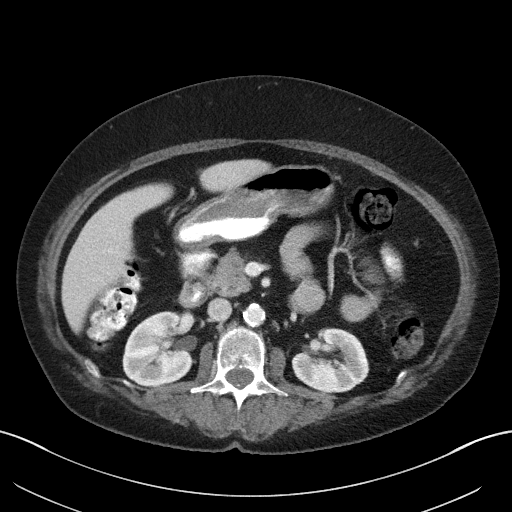
[im 65/93  soft-tissue]
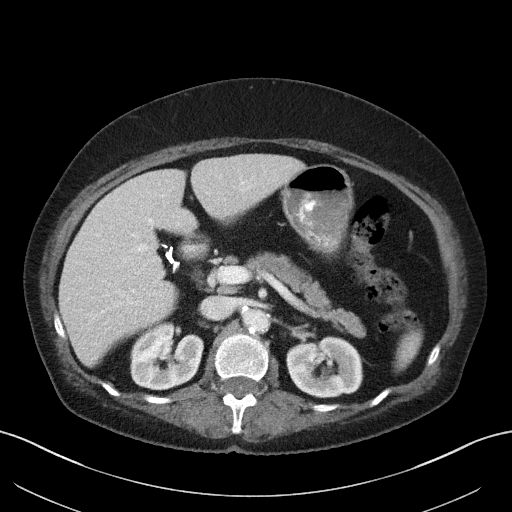
[im 65/93  bone]
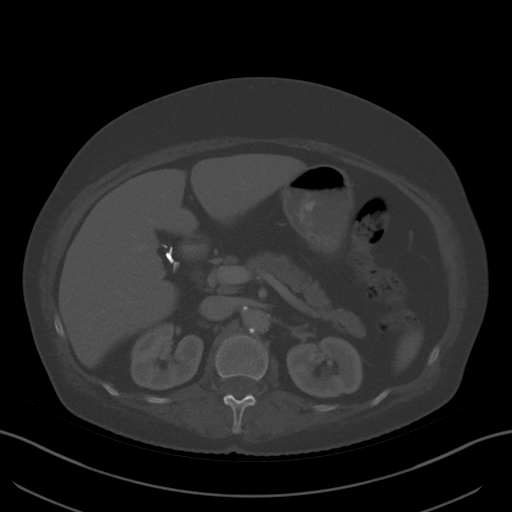
[im 71/93  soft-tissue]
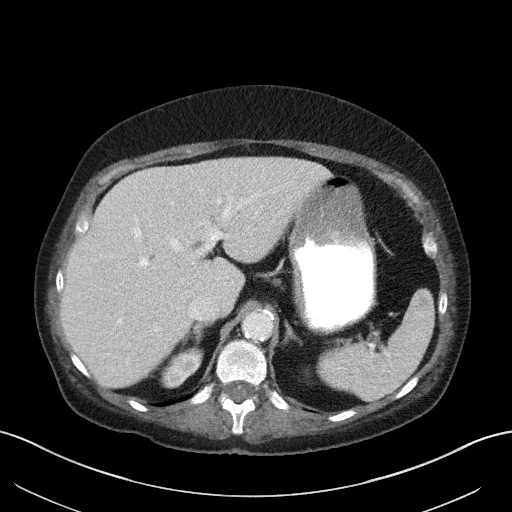
[im 82/93  soft-tissue]
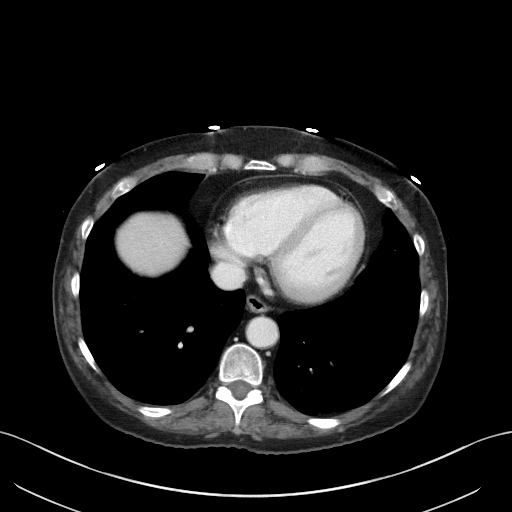
[im 87/93  soft-tissue]
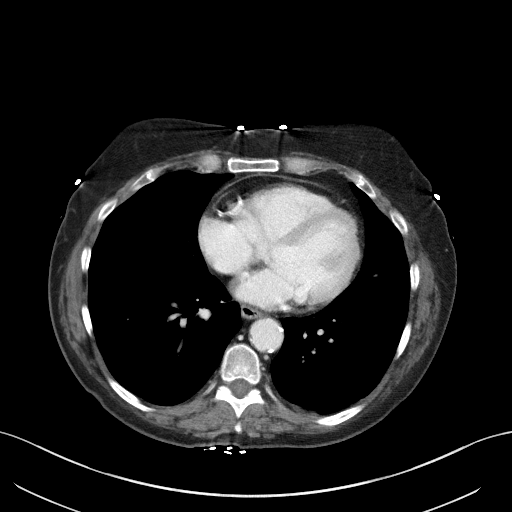

[Series 5: coronal st · coronal · 0.72mm/px · 3 of 98 slices shown]
[im 33/98  soft-tissue]
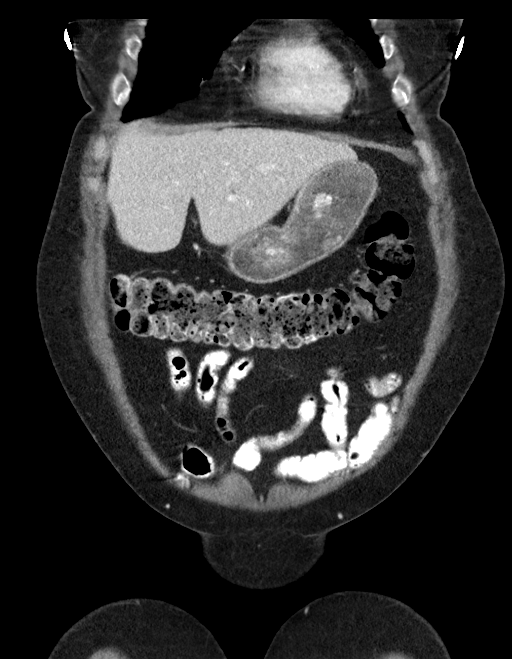
[im 44/98  soft-tissue]
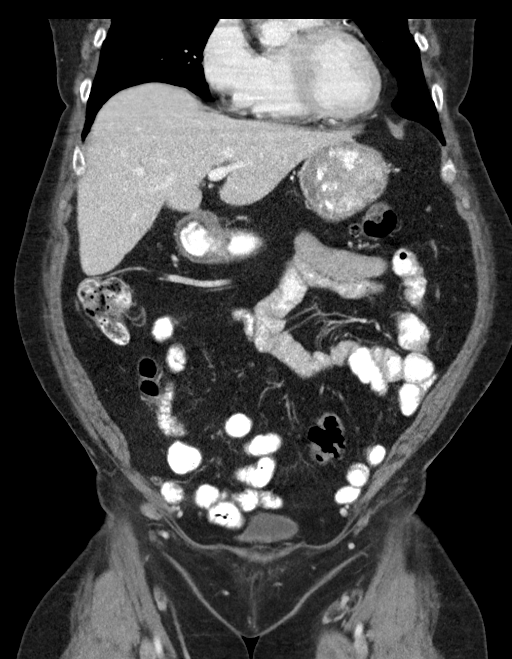
[im 54/98  soft-tissue]
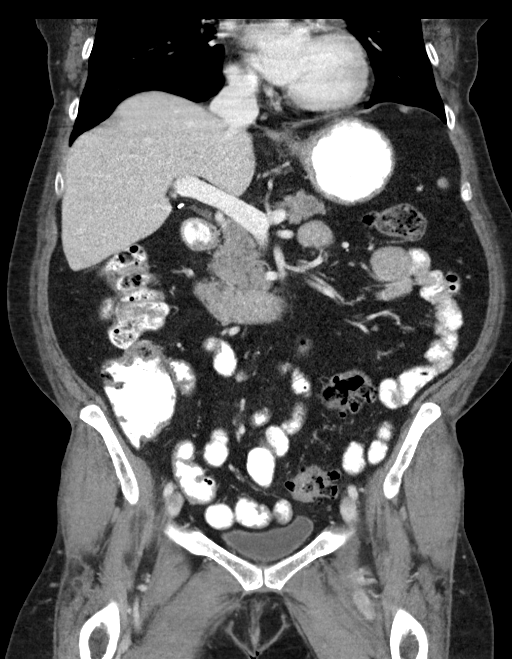

[15 of 46 positions shown; findings below may reference images not displayed]

FINDINGS: Lower chest: Visualized lung bases are clear. Mildly prominent
extrapleural fat is noted posteriorly on the left.

Hepatobiliary: No focal liver abnormality is seen. Status post
cholecystectomy. No biliary dilatation.

Pancreas: Unremarkable.

Spleen: 8 mm low-density lesion in the anterolateral spleen is
unchanged. There is a 6 mm low-density lesion more medially which
was not clearly demonstrated on the prior study. Both of these are
too small to fully characterize but are most likely benign.

Adrenals/Urinary Tract: Unremarkable adrenal glands. Subcentimeter
bilateral renal hypodensities, too small to fully characterize. No
renal calculi, hydronephrosis, or ureteral calculi are identified.
The bladder is unremarkable.

Stomach/Bowel: The stomach is within normal limits. A 7 mm duodenal
lipoma is unchanged. Oral contrast is present in nondilated loops of
small and large bowel to the level of the ascending colon without
evidence of obstruction. There is sigmoid colon diverticulosis
without evidence of diverticulitis. The appendix is unremarkable.

Vascular/Lymphatic: Diffuse abdominal aortic atherosclerosis without
aneurysm. No enlarged lymph nodes are identified.

Reproductive: Uterus and bilateral adnexa are unremarkable.

Other: No intraperitoneal free fluid. No abdominal wall mass or
hernia.

Musculoskeletal: Mild T12 superior endplate compression fracture
demonstrates 20% height loss and is new from the prior CT as well as
from more recent 06/29/2013 lumbar spine radiographs. Minimal
superior endplate retropulsion measures 3 mm without associated
stenosis. No evidence of significant paravertebral hematoma.
IMPRESSION: 1. No etiology of right lower quadrant pain identified. Normal
appendix.
2. Mild T12 compression fracture, age indeterminate though new from
3. Aortic atherosclerosis.

## 2020-06-27 DIAGNOSIS — Z6821 Body mass index (BMI) 21.0-21.9, adult: Secondary | ICD-10-CM | POA: Diagnosis not present

## 2020-06-27 DIAGNOSIS — M5410 Radiculopathy, site unspecified: Secondary | ICD-10-CM | POA: Diagnosis not present

## 2020-06-27 DIAGNOSIS — J45909 Unspecified asthma, uncomplicated: Secondary | ICD-10-CM | POA: Diagnosis not present

## 2020-06-27 DIAGNOSIS — G894 Chronic pain syndrome: Secondary | ICD-10-CM | POA: Diagnosis not present

## 2020-08-08 DIAGNOSIS — I10 Essential (primary) hypertension: Secondary | ICD-10-CM | POA: Diagnosis not present

## 2020-08-08 DIAGNOSIS — G894 Chronic pain syndrome: Secondary | ICD-10-CM | POA: Diagnosis not present

## 2020-08-22 DIAGNOSIS — I10 Essential (primary) hypertension: Secondary | ICD-10-CM | POA: Diagnosis not present

## 2020-11-04 DIAGNOSIS — R079 Chest pain, unspecified: Secondary | ICD-10-CM | POA: Diagnosis not present

## 2020-11-04 DIAGNOSIS — F17210 Nicotine dependence, cigarettes, uncomplicated: Secondary | ICD-10-CM | POA: Diagnosis not present

## 2020-11-04 DIAGNOSIS — I160 Hypertensive urgency: Secondary | ICD-10-CM | POA: Diagnosis not present

## 2020-11-04 DIAGNOSIS — I10 Essential (primary) hypertension: Secondary | ICD-10-CM | POA: Diagnosis not present

## 2021-02-15 DIAGNOSIS — N289 Disorder of kidney and ureter, unspecified: Secondary | ICD-10-CM | POA: Diagnosis not present

## 2021-02-15 DIAGNOSIS — I63032 Cerebral infarction due to thrombosis of left carotid artery: Secondary | ICD-10-CM | POA: Diagnosis not present

## 2021-02-15 DIAGNOSIS — I214 Non-ST elevation (NSTEMI) myocardial infarction: Secondary | ICD-10-CM | POA: Diagnosis not present

## 2021-02-15 DIAGNOSIS — Z8673 Personal history of transient ischemic attack (TIA), and cerebral infarction without residual deficits: Secondary | ICD-10-CM | POA: Diagnosis not present

## 2021-02-15 DIAGNOSIS — R64 Cachexia: Secondary | ICD-10-CM | POA: Diagnosis not present

## 2021-02-15 DIAGNOSIS — R7303 Prediabetes: Secondary | ICD-10-CM | POA: Diagnosis not present

## 2021-02-15 DIAGNOSIS — R7309 Other abnormal glucose: Secondary | ICD-10-CM | POA: Diagnosis not present

## 2021-02-15 DIAGNOSIS — I63442 Cerebral infarction due to embolism of left cerebellar artery: Secondary | ICD-10-CM | POA: Diagnosis not present

## 2021-02-15 DIAGNOSIS — I6350 Cerebral infarction due to unspecified occlusion or stenosis of unspecified cerebral artery: Secondary | ICD-10-CM | POA: Diagnosis not present

## 2021-02-15 DIAGNOSIS — G4700 Insomnia, unspecified: Secondary | ICD-10-CM | POA: Diagnosis not present

## 2021-02-15 DIAGNOSIS — Z6820 Body mass index (BMI) 20.0-20.9, adult: Secondary | ICD-10-CM | POA: Diagnosis not present

## 2021-02-15 DIAGNOSIS — I5021 Acute systolic (congestive) heart failure: Secondary | ICD-10-CM | POA: Diagnosis not present

## 2021-02-15 DIAGNOSIS — I5181 Takotsubo syndrome: Secondary | ICD-10-CM | POA: Diagnosis not present

## 2021-02-15 DIAGNOSIS — R778 Other specified abnormalities of plasma proteins: Secondary | ICD-10-CM | POA: Diagnosis not present

## 2021-02-15 DIAGNOSIS — Z87891 Personal history of nicotine dependence: Secondary | ICD-10-CM | POA: Diagnosis not present

## 2021-02-15 DIAGNOSIS — E876 Hypokalemia: Secondary | ICD-10-CM | POA: Diagnosis not present

## 2021-02-15 DIAGNOSIS — I63132 Cerebral infarction due to embolism of left carotid artery: Secondary | ICD-10-CM | POA: Diagnosis not present

## 2021-02-15 DIAGNOSIS — R29717 NIHSS score 17: Secondary | ICD-10-CM | POA: Diagnosis not present

## 2021-02-15 DIAGNOSIS — I249 Acute ischemic heart disease, unspecified: Secondary | ICD-10-CM | POA: Diagnosis not present

## 2021-02-15 DIAGNOSIS — Z79899 Other long term (current) drug therapy: Secondary | ICD-10-CM | POA: Diagnosis not present

## 2021-02-15 DIAGNOSIS — B952 Enterococcus as the cause of diseases classified elsewhere: Secondary | ICD-10-CM | POA: Diagnosis not present

## 2021-02-15 DIAGNOSIS — G8191 Hemiplegia, unspecified affecting right dominant side: Secondary | ICD-10-CM | POA: Diagnosis not present

## 2021-02-15 DIAGNOSIS — I612 Nontraumatic intracerebral hemorrhage in hemisphere, unspecified: Secondary | ICD-10-CM | POA: Diagnosis not present

## 2021-02-15 DIAGNOSIS — Z720 Tobacco use: Secondary | ICD-10-CM | POA: Diagnosis not present

## 2021-02-15 DIAGNOSIS — R29715 NIHSS score 15: Secondary | ICD-10-CM | POA: Diagnosis not present

## 2021-02-15 DIAGNOSIS — N390 Urinary tract infection, site not specified: Secondary | ICD-10-CM | POA: Diagnosis not present

## 2021-02-15 DIAGNOSIS — I6523 Occlusion and stenosis of bilateral carotid arteries: Secondary | ICD-10-CM | POA: Diagnosis not present

## 2021-02-15 DIAGNOSIS — I351 Nonrheumatic aortic (valve) insufficiency: Secondary | ICD-10-CM | POA: Diagnosis not present

## 2021-02-15 DIAGNOSIS — I110 Hypertensive heart disease with heart failure: Secondary | ICD-10-CM | POA: Diagnosis not present

## 2021-02-15 DIAGNOSIS — E860 Dehydration: Secondary | ICD-10-CM | POA: Diagnosis not present

## 2021-02-15 DIAGNOSIS — I959 Hypotension, unspecified: Secondary | ICD-10-CM | POA: Diagnosis not present

## 2021-02-15 DIAGNOSIS — I639 Cerebral infarction, unspecified: Secondary | ICD-10-CM | POA: Diagnosis not present

## 2021-02-15 DIAGNOSIS — G936 Cerebral edema: Secondary | ICD-10-CM | POA: Diagnosis not present

## 2021-02-15 DIAGNOSIS — I160 Hypertensive urgency: Secondary | ICD-10-CM | POA: Diagnosis not present

## 2021-02-15 DIAGNOSIS — Z20822 Contact with and (suspected) exposure to covid-19: Secondary | ICD-10-CM | POA: Diagnosis not present

## 2021-02-15 DIAGNOSIS — D72829 Elevated white blood cell count, unspecified: Secondary | ICD-10-CM | POA: Diagnosis not present

## 2021-02-15 DIAGNOSIS — G9341 Metabolic encephalopathy: Secondary | ICD-10-CM | POA: Diagnosis not present

## 2021-02-15 DIAGNOSIS — D6859 Other primary thrombophilia: Secondary | ICD-10-CM | POA: Diagnosis not present

## 2021-02-15 DIAGNOSIS — R4701 Aphasia: Secondary | ICD-10-CM | POA: Diagnosis not present

## 2021-02-15 DIAGNOSIS — R29818 Other symptoms and signs involving the nervous system: Secondary | ICD-10-CM | POA: Diagnosis not present

## 2021-02-21 DIAGNOSIS — R2681 Unsteadiness on feet: Secondary | ICD-10-CM | POA: Diagnosis not present

## 2021-02-21 DIAGNOSIS — G319 Degenerative disease of nervous system, unspecified: Secondary | ICD-10-CM | POA: Diagnosis not present

## 2021-02-21 DIAGNOSIS — I6522 Occlusion and stenosis of left carotid artery: Secondary | ICD-10-CM | POA: Diagnosis not present

## 2021-02-21 DIAGNOSIS — I428 Other cardiomyopathies: Secondary | ICD-10-CM | POA: Diagnosis not present

## 2021-02-21 DIAGNOSIS — D6859 Other primary thrombophilia: Secondary | ICD-10-CM | POA: Diagnosis not present

## 2021-02-21 DIAGNOSIS — I619 Nontraumatic intracerebral hemorrhage, unspecified: Secondary | ICD-10-CM | POA: Diagnosis not present

## 2021-02-21 DIAGNOSIS — I10 Essential (primary) hypertension: Secondary | ICD-10-CM | POA: Diagnosis not present

## 2021-02-21 DIAGNOSIS — Z1159 Encounter for screening for other viral diseases: Secondary | ICD-10-CM | POA: Diagnosis not present

## 2021-02-21 DIAGNOSIS — D72829 Elevated white blood cell count, unspecified: Secondary | ICD-10-CM | POA: Diagnosis not present

## 2021-02-21 DIAGNOSIS — Z6820 Body mass index (BMI) 20.0-20.9, adult: Secondary | ICD-10-CM | POA: Diagnosis not present

## 2021-02-21 DIAGNOSIS — I6521 Occlusion and stenosis of right carotid artery: Secondary | ICD-10-CM | POA: Diagnosis not present

## 2021-02-21 DIAGNOSIS — I6782 Cerebral ischemia: Secondary | ICD-10-CM | POA: Diagnosis not present

## 2021-02-21 DIAGNOSIS — Z720 Tobacco use: Secondary | ICD-10-CM | POA: Diagnosis not present

## 2021-02-21 DIAGNOSIS — R410 Disorientation, unspecified: Secondary | ICD-10-CM | POA: Diagnosis not present

## 2021-02-21 DIAGNOSIS — R059 Cough, unspecified: Secondary | ICD-10-CM | POA: Diagnosis not present

## 2021-02-21 DIAGNOSIS — J439 Emphysema, unspecified: Secondary | ICD-10-CM | POA: Diagnosis not present

## 2021-02-21 DIAGNOSIS — N289 Disorder of kidney and ureter, unspecified: Secondary | ICD-10-CM | POA: Diagnosis not present

## 2021-02-21 DIAGNOSIS — R2241 Localized swelling, mass and lump, right lower limb: Secondary | ICD-10-CM | POA: Diagnosis not present

## 2021-02-21 DIAGNOSIS — Z01818 Encounter for other preprocedural examination: Secondary | ICD-10-CM | POA: Diagnosis not present

## 2021-02-21 DIAGNOSIS — I612 Nontraumatic intracerebral hemorrhage in hemisphere, unspecified: Secondary | ICD-10-CM | POA: Diagnosis not present

## 2021-02-21 DIAGNOSIS — R531 Weakness: Secondary | ICD-10-CM | POA: Diagnosis not present

## 2021-02-21 DIAGNOSIS — C801 Malignant (primary) neoplasm, unspecified: Secondary | ICD-10-CM | POA: Diagnosis not present

## 2021-02-21 DIAGNOSIS — K5900 Constipation, unspecified: Secondary | ICD-10-CM | POA: Diagnosis not present

## 2021-02-21 DIAGNOSIS — D739 Disease of spleen, unspecified: Secondary | ICD-10-CM | POA: Diagnosis not present

## 2021-02-21 DIAGNOSIS — I214 Non-ST elevation (NSTEMI) myocardial infarction: Secondary | ICD-10-CM | POA: Diagnosis not present

## 2021-02-21 DIAGNOSIS — I5181 Takotsubo syndrome: Secondary | ICD-10-CM | POA: Diagnosis not present

## 2021-02-21 DIAGNOSIS — I639 Cerebral infarction, unspecified: Secondary | ICD-10-CM | POA: Diagnosis not present

## 2021-02-21 DIAGNOSIS — J984 Other disorders of lung: Secondary | ICD-10-CM | POA: Diagnosis not present

## 2021-02-21 DIAGNOSIS — G8191 Hemiplegia, unspecified affecting right dominant side: Secondary | ICD-10-CM | POA: Diagnosis not present

## 2021-02-21 DIAGNOSIS — Z743 Need for continuous supervision: Secondary | ICD-10-CM | POA: Diagnosis not present

## 2021-02-21 DIAGNOSIS — N2889 Other specified disorders of kidney and ureter: Secondary | ICD-10-CM | POA: Diagnosis not present

## 2021-02-21 DIAGNOSIS — I63132 Cerebral infarction due to embolism of left carotid artery: Secondary | ICD-10-CM | POA: Diagnosis not present

## 2021-02-21 DIAGNOSIS — M6281 Muscle weakness (generalized): Secondary | ICD-10-CM | POA: Diagnosis not present

## 2021-02-21 DIAGNOSIS — I63442 Cerebral infarction due to embolism of left cerebellar artery: Secondary | ICD-10-CM | POA: Diagnosis not present

## 2021-02-21 DIAGNOSIS — I69351 Hemiplegia and hemiparesis following cerebral infarction affecting right dominant side: Secondary | ICD-10-CM | POA: Diagnosis not present

## 2021-02-21 DIAGNOSIS — I6622 Occlusion and stenosis of left posterior cerebral artery: Secondary | ICD-10-CM | POA: Diagnosis not present

## 2021-02-21 DIAGNOSIS — N281 Cyst of kidney, acquired: Secondary | ICD-10-CM | POA: Diagnosis not present

## 2021-02-21 DIAGNOSIS — G4700 Insomnia, unspecified: Secondary | ICD-10-CM | POA: Diagnosis not present

## 2021-02-21 DIAGNOSIS — I6523 Occlusion and stenosis of bilateral carotid arteries: Secondary | ICD-10-CM | POA: Diagnosis not present

## 2021-02-21 DIAGNOSIS — R0602 Shortness of breath: Secondary | ICD-10-CM | POA: Diagnosis not present

## 2021-02-21 DIAGNOSIS — R4701 Aphasia: Secondary | ICD-10-CM | POA: Diagnosis not present

## 2021-02-21 DIAGNOSIS — R29717 NIHSS score 17: Secondary | ICD-10-CM | POA: Diagnosis not present

## 2021-02-21 DIAGNOSIS — R778 Other specified abnormalities of plasma proteins: Secondary | ICD-10-CM | POA: Diagnosis not present

## 2021-02-21 DIAGNOSIS — E860 Dehydration: Secondary | ICD-10-CM | POA: Diagnosis not present

## 2021-02-21 DIAGNOSIS — I69320 Aphasia following cerebral infarction: Secondary | ICD-10-CM | POA: Diagnosis not present

## 2021-02-21 DIAGNOSIS — N390 Urinary tract infection, site not specified: Secondary | ICD-10-CM | POA: Diagnosis not present

## 2021-02-21 DIAGNOSIS — R1310 Dysphagia, unspecified: Secondary | ICD-10-CM | POA: Diagnosis not present

## 2021-02-21 DIAGNOSIS — G9341 Metabolic encephalopathy: Secondary | ICD-10-CM | POA: Diagnosis not present

## 2021-02-21 DIAGNOSIS — I959 Hypotension, unspecified: Secondary | ICD-10-CM | POA: Diagnosis not present

## 2021-02-21 DIAGNOSIS — R64 Cachexia: Secondary | ICD-10-CM | POA: Diagnosis not present

## 2021-02-21 DIAGNOSIS — I088 Other rheumatic multiple valve diseases: Secondary | ICD-10-CM | POA: Diagnosis not present

## 2021-02-21 DIAGNOSIS — Z135 Encounter for screening for eye and ear disorders: Secondary | ICD-10-CM | POA: Diagnosis not present

## 2021-02-21 DIAGNOSIS — B952 Enterococcus as the cause of diseases classified elsewhere: Secondary | ICD-10-CM | POA: Diagnosis not present

## 2021-02-21 DIAGNOSIS — Z20822 Contact with and (suspected) exposure to covid-19: Secondary | ICD-10-CM | POA: Diagnosis not present

## 2021-02-21 DIAGNOSIS — S0990XA Unspecified injury of head, initial encounter: Secondary | ICD-10-CM | POA: Diagnosis not present

## 2021-02-21 DIAGNOSIS — I69391 Dysphagia following cerebral infarction: Secondary | ICD-10-CM | POA: Diagnosis not present

## 2021-02-21 DIAGNOSIS — I429 Cardiomyopathy, unspecified: Secondary | ICD-10-CM | POA: Diagnosis not present

## 2021-03-03 DIAGNOSIS — M6281 Muscle weakness (generalized): Secondary | ICD-10-CM | POA: Diagnosis not present

## 2021-03-03 DIAGNOSIS — F17200 Nicotine dependence, unspecified, uncomplicated: Secondary | ICD-10-CM | POA: Diagnosis not present

## 2021-03-03 DIAGNOSIS — I5181 Takotsubo syndrome: Secondary | ICD-10-CM | POA: Diagnosis not present

## 2021-03-03 DIAGNOSIS — R2681 Unsteadiness on feet: Secondary | ICD-10-CM | POA: Diagnosis not present

## 2021-03-03 DIAGNOSIS — I639 Cerebral infarction, unspecified: Secondary | ICD-10-CM | POA: Diagnosis not present

## 2021-03-03 DIAGNOSIS — N390 Urinary tract infection, site not specified: Secondary | ICD-10-CM | POA: Diagnosis not present

## 2021-03-03 DIAGNOSIS — I428 Other cardiomyopathies: Secondary | ICD-10-CM | POA: Diagnosis not present

## 2021-03-03 DIAGNOSIS — R64 Cachexia: Secondary | ICD-10-CM | POA: Diagnosis not present

## 2021-03-03 DIAGNOSIS — R531 Weakness: Secondary | ICD-10-CM | POA: Diagnosis not present

## 2021-03-03 DIAGNOSIS — U071 COVID-19: Secondary | ICD-10-CM | POA: Diagnosis not present

## 2021-03-03 DIAGNOSIS — G4700 Insomnia, unspecified: Secondary | ICD-10-CM | POA: Diagnosis not present

## 2021-03-03 DIAGNOSIS — Z743 Need for continuous supervision: Secondary | ICD-10-CM | POA: Diagnosis not present

## 2021-03-03 DIAGNOSIS — I69391 Dysphagia following cerebral infarction: Secondary | ICD-10-CM | POA: Diagnosis not present

## 2021-03-03 DIAGNOSIS — I69320 Aphasia following cerebral infarction: Secondary | ICD-10-CM | POA: Diagnosis not present

## 2021-03-03 DIAGNOSIS — I6521 Occlusion and stenosis of right carotid artery: Secondary | ICD-10-CM | POA: Diagnosis not present

## 2021-03-03 DIAGNOSIS — I214 Non-ST elevation (NSTEMI) myocardial infarction: Secondary | ICD-10-CM | POA: Diagnosis not present

## 2021-03-03 DIAGNOSIS — R778 Other specified abnormalities of plasma proteins: Secondary | ICD-10-CM | POA: Diagnosis not present

## 2021-03-03 DIAGNOSIS — Z1159 Encounter for screening for other viral diseases: Secondary | ICD-10-CM | POA: Diagnosis not present

## 2021-03-03 DIAGNOSIS — I69351 Hemiplegia and hemiparesis following cerebral infarction affecting right dominant side: Secondary | ICD-10-CM | POA: Diagnosis not present

## 2021-03-03 DIAGNOSIS — I6522 Occlusion and stenosis of left carotid artery: Secondary | ICD-10-CM | POA: Diagnosis not present

## 2021-03-03 DIAGNOSIS — F32A Depression, unspecified: Secondary | ICD-10-CM | POA: Diagnosis not present

## 2021-03-05 DIAGNOSIS — F32A Depression, unspecified: Secondary | ICD-10-CM | POA: Diagnosis not present

## 2021-03-05 DIAGNOSIS — M6281 Muscle weakness (generalized): Secondary | ICD-10-CM | POA: Diagnosis not present

## 2021-03-05 DIAGNOSIS — I5181 Takotsubo syndrome: Secondary | ICD-10-CM | POA: Diagnosis not present

## 2021-03-05 DIAGNOSIS — I69351 Hemiplegia and hemiparesis following cerebral infarction affecting right dominant side: Secondary | ICD-10-CM | POA: Diagnosis not present

## 2021-03-05 DIAGNOSIS — R2681 Unsteadiness on feet: Secondary | ICD-10-CM | POA: Diagnosis not present

## 2021-03-05 DIAGNOSIS — I69391 Dysphagia following cerebral infarction: Secondary | ICD-10-CM | POA: Diagnosis not present

## 2021-03-05 DIAGNOSIS — I69320 Aphasia following cerebral infarction: Secondary | ICD-10-CM | POA: Diagnosis not present

## 2021-03-05 DIAGNOSIS — F17200 Nicotine dependence, unspecified, uncomplicated: Secondary | ICD-10-CM | POA: Diagnosis not present

## 2021-03-05 DIAGNOSIS — Z1159 Encounter for screening for other viral diseases: Secondary | ICD-10-CM | POA: Diagnosis not present

## 2021-03-05 DIAGNOSIS — I214 Non-ST elevation (NSTEMI) myocardial infarction: Secondary | ICD-10-CM | POA: Diagnosis not present

## 2021-03-07 DIAGNOSIS — I69391 Dysphagia following cerebral infarction: Secondary | ICD-10-CM | POA: Diagnosis not present

## 2021-03-07 DIAGNOSIS — I639 Cerebral infarction, unspecified: Secondary | ICD-10-CM | POA: Diagnosis not present

## 2021-03-07 DIAGNOSIS — I69320 Aphasia following cerebral infarction: Secondary | ICD-10-CM | POA: Diagnosis not present

## 2021-03-07 DIAGNOSIS — Z1159 Encounter for screening for other viral diseases: Secondary | ICD-10-CM | POA: Diagnosis not present

## 2021-03-07 DIAGNOSIS — R2681 Unsteadiness on feet: Secondary | ICD-10-CM | POA: Diagnosis not present

## 2021-03-07 DIAGNOSIS — I69351 Hemiplegia and hemiparesis following cerebral infarction affecting right dominant side: Secondary | ICD-10-CM | POA: Diagnosis not present

## 2021-03-07 DIAGNOSIS — M6281 Muscle weakness (generalized): Secondary | ICD-10-CM | POA: Diagnosis not present

## 2021-03-07 DIAGNOSIS — I5181 Takotsubo syndrome: Secondary | ICD-10-CM | POA: Diagnosis not present

## 2021-03-08 DIAGNOSIS — Z1159 Encounter for screening for other viral diseases: Secondary | ICD-10-CM | POA: Diagnosis not present

## 2021-03-08 DIAGNOSIS — I214 Non-ST elevation (NSTEMI) myocardial infarction: Secondary | ICD-10-CM | POA: Diagnosis not present

## 2021-03-08 DIAGNOSIS — N390 Urinary tract infection, site not specified: Secondary | ICD-10-CM | POA: Diagnosis not present

## 2021-03-08 DIAGNOSIS — I639 Cerebral infarction, unspecified: Secondary | ICD-10-CM | POA: Diagnosis not present

## 2021-03-08 DIAGNOSIS — F32A Depression, unspecified: Secondary | ICD-10-CM | POA: Diagnosis not present

## 2021-03-09 DIAGNOSIS — M6281 Muscle weakness (generalized): Secondary | ICD-10-CM | POA: Diagnosis not present

## 2021-03-09 DIAGNOSIS — I639 Cerebral infarction, unspecified: Secondary | ICD-10-CM | POA: Diagnosis not present

## 2021-03-09 DIAGNOSIS — R2681 Unsteadiness on feet: Secondary | ICD-10-CM | POA: Diagnosis not present

## 2021-03-09 DIAGNOSIS — I69391 Dysphagia following cerebral infarction: Secondary | ICD-10-CM | POA: Diagnosis not present

## 2021-03-09 DIAGNOSIS — I69320 Aphasia following cerebral infarction: Secondary | ICD-10-CM | POA: Diagnosis not present

## 2021-03-09 DIAGNOSIS — I69351 Hemiplegia and hemiparesis following cerebral infarction affecting right dominant side: Secondary | ICD-10-CM | POA: Diagnosis not present

## 2021-03-09 DIAGNOSIS — Z1159 Encounter for screening for other viral diseases: Secondary | ICD-10-CM | POA: Diagnosis not present

## 2021-03-12 DIAGNOSIS — I639 Cerebral infarction, unspecified: Secondary | ICD-10-CM | POA: Diagnosis not present

## 2021-03-19 DIAGNOSIS — U071 COVID-19: Secondary | ICD-10-CM | POA: Diagnosis not present

## 2021-03-20 DIAGNOSIS — U071 COVID-19: Secondary | ICD-10-CM | POA: Diagnosis not present

## 2021-03-22 DIAGNOSIS — U071 COVID-19: Secondary | ICD-10-CM | POA: Diagnosis not present

## 2021-03-26 DIAGNOSIS — Z1159 Encounter for screening for other viral diseases: Secondary | ICD-10-CM | POA: Diagnosis not present

## 2021-03-26 DIAGNOSIS — R2681 Unsteadiness on feet: Secondary | ICD-10-CM | POA: Diagnosis not present

## 2021-03-26 DIAGNOSIS — I69391 Dysphagia following cerebral infarction: Secondary | ICD-10-CM | POA: Diagnosis not present

## 2021-03-26 DIAGNOSIS — I69320 Aphasia following cerebral infarction: Secondary | ICD-10-CM | POA: Diagnosis not present

## 2021-03-26 DIAGNOSIS — M6281 Muscle weakness (generalized): Secondary | ICD-10-CM | POA: Diagnosis not present

## 2021-03-26 DIAGNOSIS — I69351 Hemiplegia and hemiparesis following cerebral infarction affecting right dominant side: Secondary | ICD-10-CM | POA: Diagnosis not present

## 2021-03-27 DIAGNOSIS — I639 Cerebral infarction, unspecified: Secondary | ICD-10-CM | POA: Diagnosis not present

## 2021-03-27 DIAGNOSIS — R531 Weakness: Secondary | ICD-10-CM | POA: Diagnosis not present

## 2021-03-28 DIAGNOSIS — I69351 Hemiplegia and hemiparesis following cerebral infarction affecting right dominant side: Secondary | ICD-10-CM | POA: Diagnosis not present

## 2021-03-28 DIAGNOSIS — I69320 Aphasia following cerebral infarction: Secondary | ICD-10-CM | POA: Diagnosis not present

## 2021-03-28 DIAGNOSIS — Z1159 Encounter for screening for other viral diseases: Secondary | ICD-10-CM | POA: Diagnosis not present

## 2021-03-28 DIAGNOSIS — M6281 Muscle weakness (generalized): Secondary | ICD-10-CM | POA: Diagnosis not present

## 2021-03-28 DIAGNOSIS — I69391 Dysphagia following cerebral infarction: Secondary | ICD-10-CM | POA: Diagnosis not present

## 2021-03-28 DIAGNOSIS — R2681 Unsteadiness on feet: Secondary | ICD-10-CM | POA: Diagnosis not present

## 2021-03-29 DIAGNOSIS — I639 Cerebral infarction, unspecified: Secondary | ICD-10-CM | POA: Diagnosis not present

## 2021-03-29 DIAGNOSIS — M6281 Muscle weakness (generalized): Secondary | ICD-10-CM | POA: Diagnosis not present

## 2021-03-29 DIAGNOSIS — I214 Non-ST elevation (NSTEMI) myocardial infarction: Secondary | ICD-10-CM | POA: Diagnosis not present

## 2021-03-29 DIAGNOSIS — I69351 Hemiplegia and hemiparesis following cerebral infarction affecting right dominant side: Secondary | ICD-10-CM | POA: Diagnosis not present

## 2021-03-29 DIAGNOSIS — I69391 Dysphagia following cerebral infarction: Secondary | ICD-10-CM | POA: Diagnosis not present

## 2021-03-29 DIAGNOSIS — Z1159 Encounter for screening for other viral diseases: Secondary | ICD-10-CM | POA: Diagnosis not present

## 2021-03-29 DIAGNOSIS — R2681 Unsteadiness on feet: Secondary | ICD-10-CM | POA: Diagnosis not present

## 2021-03-29 DIAGNOSIS — I69320 Aphasia following cerebral infarction: Secondary | ICD-10-CM | POA: Diagnosis not present

## 2021-03-30 DIAGNOSIS — M6281 Muscle weakness (generalized): Secondary | ICD-10-CM | POA: Diagnosis not present

## 2021-03-30 DIAGNOSIS — Z1159 Encounter for screening for other viral diseases: Secondary | ICD-10-CM | POA: Diagnosis not present

## 2021-03-30 DIAGNOSIS — I69391 Dysphagia following cerebral infarction: Secondary | ICD-10-CM | POA: Diagnosis not present

## 2021-03-30 DIAGNOSIS — R2681 Unsteadiness on feet: Secondary | ICD-10-CM | POA: Diagnosis not present

## 2021-03-30 DIAGNOSIS — I69320 Aphasia following cerebral infarction: Secondary | ICD-10-CM | POA: Diagnosis not present

## 2021-03-30 DIAGNOSIS — I69351 Hemiplegia and hemiparesis following cerebral infarction affecting right dominant side: Secondary | ICD-10-CM | POA: Diagnosis not present

## 2021-03-31 DIAGNOSIS — Z1159 Encounter for screening for other viral diseases: Secondary | ICD-10-CM | POA: Diagnosis not present

## 2021-03-31 DIAGNOSIS — I69351 Hemiplegia and hemiparesis following cerebral infarction affecting right dominant side: Secondary | ICD-10-CM | POA: Diagnosis not present

## 2021-03-31 DIAGNOSIS — R2681 Unsteadiness on feet: Secondary | ICD-10-CM | POA: Diagnosis not present

## 2021-03-31 DIAGNOSIS — I69320 Aphasia following cerebral infarction: Secondary | ICD-10-CM | POA: Diagnosis not present

## 2021-03-31 DIAGNOSIS — I69391 Dysphagia following cerebral infarction: Secondary | ICD-10-CM | POA: Diagnosis not present

## 2021-03-31 DIAGNOSIS — M6281 Muscle weakness (generalized): Secondary | ICD-10-CM | POA: Diagnosis not present

## 2021-04-02 DIAGNOSIS — I69320 Aphasia following cerebral infarction: Secondary | ICD-10-CM | POA: Diagnosis not present

## 2021-04-02 DIAGNOSIS — R531 Weakness: Secondary | ICD-10-CM | POA: Diagnosis not present

## 2021-04-02 DIAGNOSIS — R2681 Unsteadiness on feet: Secondary | ICD-10-CM | POA: Diagnosis not present

## 2021-04-02 DIAGNOSIS — M6281 Muscle weakness (generalized): Secondary | ICD-10-CM | POA: Diagnosis not present

## 2021-04-02 DIAGNOSIS — Z1159 Encounter for screening for other viral diseases: Secondary | ICD-10-CM | POA: Diagnosis not present

## 2021-04-02 DIAGNOSIS — I69351 Hemiplegia and hemiparesis following cerebral infarction affecting right dominant side: Secondary | ICD-10-CM | POA: Diagnosis not present

## 2021-04-02 DIAGNOSIS — I69391 Dysphagia following cerebral infarction: Secondary | ICD-10-CM | POA: Diagnosis not present

## 2021-04-03 DIAGNOSIS — M6281 Muscle weakness (generalized): Secondary | ICD-10-CM | POA: Diagnosis not present

## 2021-04-03 DIAGNOSIS — Z1159 Encounter for screening for other viral diseases: Secondary | ICD-10-CM | POA: Diagnosis not present

## 2021-04-03 DIAGNOSIS — R2681 Unsteadiness on feet: Secondary | ICD-10-CM | POA: Diagnosis not present

## 2021-04-03 DIAGNOSIS — I69391 Dysphagia following cerebral infarction: Secondary | ICD-10-CM | POA: Diagnosis not present

## 2021-04-03 DIAGNOSIS — I69320 Aphasia following cerebral infarction: Secondary | ICD-10-CM | POA: Diagnosis not present

## 2021-04-03 DIAGNOSIS — I69351 Hemiplegia and hemiparesis following cerebral infarction affecting right dominant side: Secondary | ICD-10-CM | POA: Diagnosis not present

## 2021-04-04 DIAGNOSIS — Z1159 Encounter for screening for other viral diseases: Secondary | ICD-10-CM | POA: Diagnosis not present

## 2021-04-04 DIAGNOSIS — I69351 Hemiplegia and hemiparesis following cerebral infarction affecting right dominant side: Secondary | ICD-10-CM | POA: Diagnosis not present

## 2021-04-04 DIAGNOSIS — R2681 Unsteadiness on feet: Secondary | ICD-10-CM | POA: Diagnosis not present

## 2021-04-04 DIAGNOSIS — M6281 Muscle weakness (generalized): Secondary | ICD-10-CM | POA: Diagnosis not present

## 2021-04-04 DIAGNOSIS — I69320 Aphasia following cerebral infarction: Secondary | ICD-10-CM | POA: Diagnosis not present

## 2021-04-04 DIAGNOSIS — I69391 Dysphagia following cerebral infarction: Secondary | ICD-10-CM | POA: Diagnosis not present

## 2021-04-05 DIAGNOSIS — I69391 Dysphagia following cerebral infarction: Secondary | ICD-10-CM | POA: Diagnosis not present

## 2021-04-05 DIAGNOSIS — R2681 Unsteadiness on feet: Secondary | ICD-10-CM | POA: Diagnosis not present

## 2021-04-05 DIAGNOSIS — Z1159 Encounter for screening for other viral diseases: Secondary | ICD-10-CM | POA: Diagnosis not present

## 2021-04-05 DIAGNOSIS — I69351 Hemiplegia and hemiparesis following cerebral infarction affecting right dominant side: Secondary | ICD-10-CM | POA: Diagnosis not present

## 2021-04-05 DIAGNOSIS — M6281 Muscle weakness (generalized): Secondary | ICD-10-CM | POA: Diagnosis not present

## 2021-04-05 DIAGNOSIS — I69320 Aphasia following cerebral infarction: Secondary | ICD-10-CM | POA: Diagnosis not present

## 2021-04-06 DIAGNOSIS — I639 Cerebral infarction, unspecified: Secondary | ICD-10-CM | POA: Diagnosis not present

## 2021-04-06 DIAGNOSIS — R531 Weakness: Secondary | ICD-10-CM | POA: Diagnosis not present

## 2021-04-06 DIAGNOSIS — I69351 Hemiplegia and hemiparesis following cerebral infarction affecting right dominant side: Secondary | ICD-10-CM | POA: Diagnosis not present

## 2021-04-06 DIAGNOSIS — M6281 Muscle weakness (generalized): Secondary | ICD-10-CM | POA: Diagnosis not present

## 2021-04-06 DIAGNOSIS — I69320 Aphasia following cerebral infarction: Secondary | ICD-10-CM | POA: Diagnosis not present

## 2021-04-06 DIAGNOSIS — R2681 Unsteadiness on feet: Secondary | ICD-10-CM | POA: Diagnosis not present

## 2021-04-06 DIAGNOSIS — Z1159 Encounter for screening for other viral diseases: Secondary | ICD-10-CM | POA: Diagnosis not present

## 2021-04-06 DIAGNOSIS — I69391 Dysphagia following cerebral infarction: Secondary | ICD-10-CM | POA: Diagnosis not present

## 2021-04-09 DIAGNOSIS — M6281 Muscle weakness (generalized): Secondary | ICD-10-CM | POA: Diagnosis not present

## 2021-04-09 DIAGNOSIS — I69351 Hemiplegia and hemiparesis following cerebral infarction affecting right dominant side: Secondary | ICD-10-CM | POA: Diagnosis not present

## 2021-04-09 DIAGNOSIS — R2681 Unsteadiness on feet: Secondary | ICD-10-CM | POA: Diagnosis not present

## 2021-04-09 DIAGNOSIS — I69320 Aphasia following cerebral infarction: Secondary | ICD-10-CM | POA: Diagnosis not present

## 2021-04-09 DIAGNOSIS — Z1159 Encounter for screening for other viral diseases: Secondary | ICD-10-CM | POA: Diagnosis not present

## 2021-04-09 DIAGNOSIS — I69391 Dysphagia following cerebral infarction: Secondary | ICD-10-CM | POA: Diagnosis not present

## 2021-04-10 DIAGNOSIS — R2681 Unsteadiness on feet: Secondary | ICD-10-CM | POA: Diagnosis not present

## 2021-04-10 DIAGNOSIS — Z1159 Encounter for screening for other viral diseases: Secondary | ICD-10-CM | POA: Diagnosis not present

## 2021-04-10 DIAGNOSIS — I69351 Hemiplegia and hemiparesis following cerebral infarction affecting right dominant side: Secondary | ICD-10-CM | POA: Diagnosis not present

## 2021-04-10 DIAGNOSIS — M6281 Muscle weakness (generalized): Secondary | ICD-10-CM | POA: Diagnosis not present

## 2021-04-10 DIAGNOSIS — I69391 Dysphagia following cerebral infarction: Secondary | ICD-10-CM | POA: Diagnosis not present

## 2021-04-10 DIAGNOSIS — I69320 Aphasia following cerebral infarction: Secondary | ICD-10-CM | POA: Diagnosis not present

## 2021-04-11 DIAGNOSIS — I69320 Aphasia following cerebral infarction: Secondary | ICD-10-CM | POA: Diagnosis not present

## 2021-04-11 DIAGNOSIS — Z1159 Encounter for screening for other viral diseases: Secondary | ICD-10-CM | POA: Diagnosis not present

## 2021-04-11 DIAGNOSIS — R2681 Unsteadiness on feet: Secondary | ICD-10-CM | POA: Diagnosis not present

## 2021-04-11 DIAGNOSIS — I69391 Dysphagia following cerebral infarction: Secondary | ICD-10-CM | POA: Diagnosis not present

## 2021-04-11 DIAGNOSIS — M6281 Muscle weakness (generalized): Secondary | ICD-10-CM | POA: Diagnosis not present

## 2021-04-11 DIAGNOSIS — I69351 Hemiplegia and hemiparesis following cerebral infarction affecting right dominant side: Secondary | ICD-10-CM | POA: Diagnosis not present

## 2021-04-12 DIAGNOSIS — M6281 Muscle weakness (generalized): Secondary | ICD-10-CM | POA: Diagnosis not present

## 2021-04-12 DIAGNOSIS — I69391 Dysphagia following cerebral infarction: Secondary | ICD-10-CM | POA: Diagnosis not present

## 2021-04-12 DIAGNOSIS — Z1159 Encounter for screening for other viral diseases: Secondary | ICD-10-CM | POA: Diagnosis not present

## 2021-04-12 DIAGNOSIS — R2681 Unsteadiness on feet: Secondary | ICD-10-CM | POA: Diagnosis not present

## 2021-04-12 DIAGNOSIS — I69351 Hemiplegia and hemiparesis following cerebral infarction affecting right dominant side: Secondary | ICD-10-CM | POA: Diagnosis not present

## 2021-04-12 DIAGNOSIS — I69320 Aphasia following cerebral infarction: Secondary | ICD-10-CM | POA: Diagnosis not present

## 2021-04-13 DIAGNOSIS — I69391 Dysphagia following cerebral infarction: Secondary | ICD-10-CM | POA: Diagnosis not present

## 2021-04-13 DIAGNOSIS — I69351 Hemiplegia and hemiparesis following cerebral infarction affecting right dominant side: Secondary | ICD-10-CM | POA: Diagnosis not present

## 2021-04-13 DIAGNOSIS — I69320 Aphasia following cerebral infarction: Secondary | ICD-10-CM | POA: Diagnosis not present

## 2021-04-13 DIAGNOSIS — R2681 Unsteadiness on feet: Secondary | ICD-10-CM | POA: Diagnosis not present

## 2021-04-13 DIAGNOSIS — Z1159 Encounter for screening for other viral diseases: Secondary | ICD-10-CM | POA: Diagnosis not present

## 2021-04-13 DIAGNOSIS — M6281 Muscle weakness (generalized): Secondary | ICD-10-CM | POA: Diagnosis not present

## 2021-04-15 DIAGNOSIS — R2681 Unsteadiness on feet: Secondary | ICD-10-CM | POA: Diagnosis not present

## 2021-04-15 DIAGNOSIS — M6281 Muscle weakness (generalized): Secondary | ICD-10-CM | POA: Diagnosis not present

## 2021-04-15 DIAGNOSIS — Z1159 Encounter for screening for other viral diseases: Secondary | ICD-10-CM | POA: Diagnosis not present

## 2021-04-15 DIAGNOSIS — I69391 Dysphagia following cerebral infarction: Secondary | ICD-10-CM | POA: Diagnosis not present

## 2021-04-15 DIAGNOSIS — I69351 Hemiplegia and hemiparesis following cerebral infarction affecting right dominant side: Secondary | ICD-10-CM | POA: Diagnosis not present

## 2021-04-15 DIAGNOSIS — I69320 Aphasia following cerebral infarction: Secondary | ICD-10-CM | POA: Diagnosis not present

## 2021-04-16 DIAGNOSIS — I69391 Dysphagia following cerebral infarction: Secondary | ICD-10-CM | POA: Diagnosis not present

## 2021-04-16 DIAGNOSIS — M6281 Muscle weakness (generalized): Secondary | ICD-10-CM | POA: Diagnosis not present

## 2021-04-16 DIAGNOSIS — I5181 Takotsubo syndrome: Secondary | ICD-10-CM | POA: Diagnosis not present

## 2021-04-16 DIAGNOSIS — R2681 Unsteadiness on feet: Secondary | ICD-10-CM | POA: Diagnosis not present

## 2021-04-16 DIAGNOSIS — I214 Non-ST elevation (NSTEMI) myocardial infarction: Secondary | ICD-10-CM | POA: Diagnosis not present

## 2021-04-16 DIAGNOSIS — I69351 Hemiplegia and hemiparesis following cerebral infarction affecting right dominant side: Secondary | ICD-10-CM | POA: Diagnosis not present

## 2021-04-16 DIAGNOSIS — E785 Hyperlipidemia, unspecified: Secondary | ICD-10-CM | POA: Diagnosis not present

## 2021-04-16 DIAGNOSIS — Z1159 Encounter for screening for other viral diseases: Secondary | ICD-10-CM | POA: Diagnosis not present

## 2021-04-16 DIAGNOSIS — I69320 Aphasia following cerebral infarction: Secondary | ICD-10-CM | POA: Diagnosis not present

## 2021-04-17 DIAGNOSIS — I69320 Aphasia following cerebral infarction: Secondary | ICD-10-CM | POA: Diagnosis not present

## 2021-04-17 DIAGNOSIS — I69351 Hemiplegia and hemiparesis following cerebral infarction affecting right dominant side: Secondary | ICD-10-CM | POA: Diagnosis not present

## 2021-04-17 DIAGNOSIS — R2681 Unsteadiness on feet: Secondary | ICD-10-CM | POA: Diagnosis not present

## 2021-04-17 DIAGNOSIS — M6281 Muscle weakness (generalized): Secondary | ICD-10-CM | POA: Diagnosis not present

## 2021-04-17 DIAGNOSIS — Z1159 Encounter for screening for other viral diseases: Secondary | ICD-10-CM | POA: Diagnosis not present

## 2021-04-17 DIAGNOSIS — I69391 Dysphagia following cerebral infarction: Secondary | ICD-10-CM | POA: Diagnosis not present

## 2021-04-18 DIAGNOSIS — R2681 Unsteadiness on feet: Secondary | ICD-10-CM | POA: Diagnosis not present

## 2021-04-18 DIAGNOSIS — I69391 Dysphagia following cerebral infarction: Secondary | ICD-10-CM | POA: Diagnosis not present

## 2021-04-18 DIAGNOSIS — I69351 Hemiplegia and hemiparesis following cerebral infarction affecting right dominant side: Secondary | ICD-10-CM | POA: Diagnosis not present

## 2021-04-18 DIAGNOSIS — Z1159 Encounter for screening for other viral diseases: Secondary | ICD-10-CM | POA: Diagnosis not present

## 2021-04-18 DIAGNOSIS — M6281 Muscle weakness (generalized): Secondary | ICD-10-CM | POA: Diagnosis not present

## 2021-04-18 DIAGNOSIS — I69320 Aphasia following cerebral infarction: Secondary | ICD-10-CM | POA: Diagnosis not present

## 2021-04-19 DIAGNOSIS — R2681 Unsteadiness on feet: Secondary | ICD-10-CM | POA: Diagnosis not present

## 2021-04-19 DIAGNOSIS — Z1159 Encounter for screening for other viral diseases: Secondary | ICD-10-CM | POA: Diagnosis not present

## 2021-04-19 DIAGNOSIS — I69320 Aphasia following cerebral infarction: Secondary | ICD-10-CM | POA: Diagnosis not present

## 2021-04-19 DIAGNOSIS — I69391 Dysphagia following cerebral infarction: Secondary | ICD-10-CM | POA: Diagnosis not present

## 2021-04-19 DIAGNOSIS — I69351 Hemiplegia and hemiparesis following cerebral infarction affecting right dominant side: Secondary | ICD-10-CM | POA: Diagnosis not present

## 2021-04-19 DIAGNOSIS — M6281 Muscle weakness (generalized): Secondary | ICD-10-CM | POA: Diagnosis not present

## 2021-04-20 DIAGNOSIS — M6281 Muscle weakness (generalized): Secondary | ICD-10-CM | POA: Diagnosis not present

## 2021-04-20 DIAGNOSIS — I69320 Aphasia following cerebral infarction: Secondary | ICD-10-CM | POA: Diagnosis not present

## 2021-04-20 DIAGNOSIS — Z1159 Encounter for screening for other viral diseases: Secondary | ICD-10-CM | POA: Diagnosis not present

## 2021-04-20 DIAGNOSIS — I69351 Hemiplegia and hemiparesis following cerebral infarction affecting right dominant side: Secondary | ICD-10-CM | POA: Diagnosis not present

## 2021-04-20 DIAGNOSIS — I69391 Dysphagia following cerebral infarction: Secondary | ICD-10-CM | POA: Diagnosis not present

## 2021-04-20 DIAGNOSIS — R2681 Unsteadiness on feet: Secondary | ICD-10-CM | POA: Diagnosis not present

## 2021-04-23 DIAGNOSIS — Z1159 Encounter for screening for other viral diseases: Secondary | ICD-10-CM | POA: Diagnosis not present

## 2021-04-23 DIAGNOSIS — R2681 Unsteadiness on feet: Secondary | ICD-10-CM | POA: Diagnosis not present

## 2021-04-23 DIAGNOSIS — M6281 Muscle weakness (generalized): Secondary | ICD-10-CM | POA: Diagnosis not present

## 2021-04-23 DIAGNOSIS — I69351 Hemiplegia and hemiparesis following cerebral infarction affecting right dominant side: Secondary | ICD-10-CM | POA: Diagnosis not present

## 2021-04-23 DIAGNOSIS — I69320 Aphasia following cerebral infarction: Secondary | ICD-10-CM | POA: Diagnosis not present

## 2021-04-23 DIAGNOSIS — I69391 Dysphagia following cerebral infarction: Secondary | ICD-10-CM | POA: Diagnosis not present

## 2021-04-24 DIAGNOSIS — I69351 Hemiplegia and hemiparesis following cerebral infarction affecting right dominant side: Secondary | ICD-10-CM | POA: Diagnosis not present

## 2021-04-24 DIAGNOSIS — Z1159 Encounter for screening for other viral diseases: Secondary | ICD-10-CM | POA: Diagnosis not present

## 2021-04-24 DIAGNOSIS — I69391 Dysphagia following cerebral infarction: Secondary | ICD-10-CM | POA: Diagnosis not present

## 2021-04-24 DIAGNOSIS — R2681 Unsteadiness on feet: Secondary | ICD-10-CM | POA: Diagnosis not present

## 2021-04-24 DIAGNOSIS — I69320 Aphasia following cerebral infarction: Secondary | ICD-10-CM | POA: Diagnosis not present

## 2021-04-24 DIAGNOSIS — M6281 Muscle weakness (generalized): Secondary | ICD-10-CM | POA: Diagnosis not present

## 2021-04-25 DIAGNOSIS — R2681 Unsteadiness on feet: Secondary | ICD-10-CM | POA: Diagnosis not present

## 2021-04-25 DIAGNOSIS — Z1159 Encounter for screening for other viral diseases: Secondary | ICD-10-CM | POA: Diagnosis not present

## 2021-04-25 DIAGNOSIS — I69351 Hemiplegia and hemiparesis following cerebral infarction affecting right dominant side: Secondary | ICD-10-CM | POA: Diagnosis not present

## 2021-04-25 DIAGNOSIS — I69391 Dysphagia following cerebral infarction: Secondary | ICD-10-CM | POA: Diagnosis not present

## 2021-04-25 DIAGNOSIS — I69320 Aphasia following cerebral infarction: Secondary | ICD-10-CM | POA: Diagnosis not present

## 2021-04-25 DIAGNOSIS — M6281 Muscle weakness (generalized): Secondary | ICD-10-CM | POA: Diagnosis not present

## 2021-04-26 DIAGNOSIS — R2681 Unsteadiness on feet: Secondary | ICD-10-CM | POA: Diagnosis not present

## 2021-04-26 DIAGNOSIS — M6281 Muscle weakness (generalized): Secondary | ICD-10-CM | POA: Diagnosis not present

## 2021-04-26 DIAGNOSIS — I69351 Hemiplegia and hemiparesis following cerebral infarction affecting right dominant side: Secondary | ICD-10-CM | POA: Diagnosis not present

## 2021-04-26 DIAGNOSIS — I69320 Aphasia following cerebral infarction: Secondary | ICD-10-CM | POA: Diagnosis not present

## 2021-04-26 DIAGNOSIS — I69391 Dysphagia following cerebral infarction: Secondary | ICD-10-CM | POA: Diagnosis not present

## 2021-04-26 DIAGNOSIS — Z1159 Encounter for screening for other viral diseases: Secondary | ICD-10-CM | POA: Diagnosis not present

## 2021-04-27 DIAGNOSIS — I69320 Aphasia following cerebral infarction: Secondary | ICD-10-CM | POA: Diagnosis not present

## 2021-04-27 DIAGNOSIS — Z1159 Encounter for screening for other viral diseases: Secondary | ICD-10-CM | POA: Diagnosis not present

## 2021-04-27 DIAGNOSIS — I69391 Dysphagia following cerebral infarction: Secondary | ICD-10-CM | POA: Diagnosis not present

## 2021-04-27 DIAGNOSIS — R2681 Unsteadiness on feet: Secondary | ICD-10-CM | POA: Diagnosis not present

## 2021-04-27 DIAGNOSIS — I69351 Hemiplegia and hemiparesis following cerebral infarction affecting right dominant side: Secondary | ICD-10-CM | POA: Diagnosis not present

## 2021-04-27 DIAGNOSIS — M6281 Muscle weakness (generalized): Secondary | ICD-10-CM | POA: Diagnosis not present

## 2021-04-29 DIAGNOSIS — M6281 Muscle weakness (generalized): Secondary | ICD-10-CM | POA: Diagnosis not present

## 2021-04-29 DIAGNOSIS — I69351 Hemiplegia and hemiparesis following cerebral infarction affecting right dominant side: Secondary | ICD-10-CM | POA: Diagnosis not present

## 2021-04-29 DIAGNOSIS — R2681 Unsteadiness on feet: Secondary | ICD-10-CM | POA: Diagnosis not present

## 2021-04-29 DIAGNOSIS — I69320 Aphasia following cerebral infarction: Secondary | ICD-10-CM | POA: Diagnosis not present

## 2021-04-29 DIAGNOSIS — Z1159 Encounter for screening for other viral diseases: Secondary | ICD-10-CM | POA: Diagnosis not present

## 2021-04-29 DIAGNOSIS — I69391 Dysphagia following cerebral infarction: Secondary | ICD-10-CM | POA: Diagnosis not present

## 2021-04-30 DIAGNOSIS — I69351 Hemiplegia and hemiparesis following cerebral infarction affecting right dominant side: Secondary | ICD-10-CM | POA: Diagnosis not present

## 2021-04-30 DIAGNOSIS — I69320 Aphasia following cerebral infarction: Secondary | ICD-10-CM | POA: Diagnosis not present

## 2021-04-30 DIAGNOSIS — I69391 Dysphagia following cerebral infarction: Secondary | ICD-10-CM | POA: Diagnosis not present

## 2021-04-30 DIAGNOSIS — M6281 Muscle weakness (generalized): Secondary | ICD-10-CM | POA: Diagnosis not present

## 2021-04-30 DIAGNOSIS — Z1159 Encounter for screening for other viral diseases: Secondary | ICD-10-CM | POA: Diagnosis not present

## 2021-04-30 DIAGNOSIS — R2681 Unsteadiness on feet: Secondary | ICD-10-CM | POA: Diagnosis not present

## 2021-05-01 DIAGNOSIS — I69391 Dysphagia following cerebral infarction: Secondary | ICD-10-CM | POA: Diagnosis not present

## 2021-05-01 DIAGNOSIS — R2681 Unsteadiness on feet: Secondary | ICD-10-CM | POA: Diagnosis not present

## 2021-05-01 DIAGNOSIS — Z1159 Encounter for screening for other viral diseases: Secondary | ICD-10-CM | POA: Diagnosis not present

## 2021-05-01 DIAGNOSIS — M6281 Muscle weakness (generalized): Secondary | ICD-10-CM | POA: Diagnosis not present

## 2021-05-01 DIAGNOSIS — I69320 Aphasia following cerebral infarction: Secondary | ICD-10-CM | POA: Diagnosis not present

## 2021-05-01 DIAGNOSIS — I69351 Hemiplegia and hemiparesis following cerebral infarction affecting right dominant side: Secondary | ICD-10-CM | POA: Diagnosis not present

## 2021-05-02 DIAGNOSIS — I69320 Aphasia following cerebral infarction: Secondary | ICD-10-CM | POA: Diagnosis not present

## 2021-05-02 DIAGNOSIS — I69391 Dysphagia following cerebral infarction: Secondary | ICD-10-CM | POA: Diagnosis not present

## 2021-05-02 DIAGNOSIS — I69351 Hemiplegia and hemiparesis following cerebral infarction affecting right dominant side: Secondary | ICD-10-CM | POA: Diagnosis not present

## 2021-05-02 DIAGNOSIS — R2681 Unsteadiness on feet: Secondary | ICD-10-CM | POA: Diagnosis not present

## 2021-05-02 DIAGNOSIS — Z1159 Encounter for screening for other viral diseases: Secondary | ICD-10-CM | POA: Diagnosis not present

## 2021-05-02 DIAGNOSIS — M6281 Muscle weakness (generalized): Secondary | ICD-10-CM | POA: Diagnosis not present

## 2021-05-03 DIAGNOSIS — I69320 Aphasia following cerebral infarction: Secondary | ICD-10-CM | POA: Diagnosis not present

## 2021-05-03 DIAGNOSIS — M6281 Muscle weakness (generalized): Secondary | ICD-10-CM | POA: Diagnosis not present

## 2021-05-03 DIAGNOSIS — I69391 Dysphagia following cerebral infarction: Secondary | ICD-10-CM | POA: Diagnosis not present

## 2021-05-03 DIAGNOSIS — R2681 Unsteadiness on feet: Secondary | ICD-10-CM | POA: Diagnosis not present

## 2021-05-03 DIAGNOSIS — Z1159 Encounter for screening for other viral diseases: Secondary | ICD-10-CM | POA: Diagnosis not present

## 2021-05-03 DIAGNOSIS — I69351 Hemiplegia and hemiparesis following cerebral infarction affecting right dominant side: Secondary | ICD-10-CM | POA: Diagnosis not present

## 2021-05-04 DIAGNOSIS — I69351 Hemiplegia and hemiparesis following cerebral infarction affecting right dominant side: Secondary | ICD-10-CM | POA: Diagnosis not present

## 2021-05-04 DIAGNOSIS — I69391 Dysphagia following cerebral infarction: Secondary | ICD-10-CM | POA: Diagnosis not present

## 2021-05-04 DIAGNOSIS — R2681 Unsteadiness on feet: Secondary | ICD-10-CM | POA: Diagnosis not present

## 2021-05-04 DIAGNOSIS — M6281 Muscle weakness (generalized): Secondary | ICD-10-CM | POA: Diagnosis not present

## 2021-05-04 DIAGNOSIS — Z1159 Encounter for screening for other viral diseases: Secondary | ICD-10-CM | POA: Diagnosis not present

## 2021-05-04 DIAGNOSIS — I69320 Aphasia following cerebral infarction: Secondary | ICD-10-CM | POA: Diagnosis not present

## 2021-05-07 DIAGNOSIS — R2681 Unsteadiness on feet: Secondary | ICD-10-CM | POA: Diagnosis not present

## 2021-05-07 DIAGNOSIS — I69391 Dysphagia following cerebral infarction: Secondary | ICD-10-CM | POA: Diagnosis not present

## 2021-05-07 DIAGNOSIS — I69320 Aphasia following cerebral infarction: Secondary | ICD-10-CM | POA: Diagnosis not present

## 2021-05-07 DIAGNOSIS — I69351 Hemiplegia and hemiparesis following cerebral infarction affecting right dominant side: Secondary | ICD-10-CM | POA: Diagnosis not present

## 2021-05-07 DIAGNOSIS — M6281 Muscle weakness (generalized): Secondary | ICD-10-CM | POA: Diagnosis not present

## 2021-05-07 DIAGNOSIS — Z1159 Encounter for screening for other viral diseases: Secondary | ICD-10-CM | POA: Diagnosis not present

## 2021-05-08 DIAGNOSIS — I69351 Hemiplegia and hemiparesis following cerebral infarction affecting right dominant side: Secondary | ICD-10-CM | POA: Diagnosis not present

## 2021-05-08 DIAGNOSIS — Z1159 Encounter for screening for other viral diseases: Secondary | ICD-10-CM | POA: Diagnosis not present

## 2021-05-08 DIAGNOSIS — R2681 Unsteadiness on feet: Secondary | ICD-10-CM | POA: Diagnosis not present

## 2021-05-08 DIAGNOSIS — I69320 Aphasia following cerebral infarction: Secondary | ICD-10-CM | POA: Diagnosis not present

## 2021-05-08 DIAGNOSIS — I69391 Dysphagia following cerebral infarction: Secondary | ICD-10-CM | POA: Diagnosis not present

## 2021-05-08 DIAGNOSIS — M6281 Muscle weakness (generalized): Secondary | ICD-10-CM | POA: Diagnosis not present

## 2021-05-09 DIAGNOSIS — I69320 Aphasia following cerebral infarction: Secondary | ICD-10-CM | POA: Diagnosis not present

## 2021-05-09 DIAGNOSIS — I69391 Dysphagia following cerebral infarction: Secondary | ICD-10-CM | POA: Diagnosis not present

## 2021-05-09 DIAGNOSIS — M6281 Muscle weakness (generalized): Secondary | ICD-10-CM | POA: Diagnosis not present

## 2021-05-09 DIAGNOSIS — Z1159 Encounter for screening for other viral diseases: Secondary | ICD-10-CM | POA: Diagnosis not present

## 2021-05-09 DIAGNOSIS — I69351 Hemiplegia and hemiparesis following cerebral infarction affecting right dominant side: Secondary | ICD-10-CM | POA: Diagnosis not present

## 2021-05-09 DIAGNOSIS — R2681 Unsteadiness on feet: Secondary | ICD-10-CM | POA: Diagnosis not present

## 2021-05-10 DIAGNOSIS — M6281 Muscle weakness (generalized): Secondary | ICD-10-CM | POA: Diagnosis not present

## 2021-05-10 DIAGNOSIS — Z1159 Encounter for screening for other viral diseases: Secondary | ICD-10-CM | POA: Diagnosis not present

## 2021-05-10 DIAGNOSIS — I69320 Aphasia following cerebral infarction: Secondary | ICD-10-CM | POA: Diagnosis not present

## 2021-05-10 DIAGNOSIS — I69351 Hemiplegia and hemiparesis following cerebral infarction affecting right dominant side: Secondary | ICD-10-CM | POA: Diagnosis not present

## 2021-05-10 DIAGNOSIS — I69391 Dysphagia following cerebral infarction: Secondary | ICD-10-CM | POA: Diagnosis not present

## 2021-05-10 DIAGNOSIS — R2681 Unsteadiness on feet: Secondary | ICD-10-CM | POA: Diagnosis not present

## 2021-05-11 DIAGNOSIS — R2681 Unsteadiness on feet: Secondary | ICD-10-CM | POA: Diagnosis not present

## 2021-05-11 DIAGNOSIS — M6281 Muscle weakness (generalized): Secondary | ICD-10-CM | POA: Diagnosis not present

## 2021-05-11 DIAGNOSIS — Z1159 Encounter for screening for other viral diseases: Secondary | ICD-10-CM | POA: Diagnosis not present

## 2021-05-11 DIAGNOSIS — I69391 Dysphagia following cerebral infarction: Secondary | ICD-10-CM | POA: Diagnosis not present

## 2021-05-11 DIAGNOSIS — I69351 Hemiplegia and hemiparesis following cerebral infarction affecting right dominant side: Secondary | ICD-10-CM | POA: Diagnosis not present

## 2021-05-11 DIAGNOSIS — I69320 Aphasia following cerebral infarction: Secondary | ICD-10-CM | POA: Diagnosis not present

## 2021-05-14 DIAGNOSIS — I69391 Dysphagia following cerebral infarction: Secondary | ICD-10-CM | POA: Diagnosis not present

## 2021-05-14 DIAGNOSIS — M6281 Muscle weakness (generalized): Secondary | ICD-10-CM | POA: Diagnosis not present

## 2021-05-14 DIAGNOSIS — I69351 Hemiplegia and hemiparesis following cerebral infarction affecting right dominant side: Secondary | ICD-10-CM | POA: Diagnosis not present

## 2021-05-14 DIAGNOSIS — Z1159 Encounter for screening for other viral diseases: Secondary | ICD-10-CM | POA: Diagnosis not present

## 2021-05-14 DIAGNOSIS — I69320 Aphasia following cerebral infarction: Secondary | ICD-10-CM | POA: Diagnosis not present

## 2021-05-14 DIAGNOSIS — R2681 Unsteadiness on feet: Secondary | ICD-10-CM | POA: Diagnosis not present

## 2021-05-15 DIAGNOSIS — I69351 Hemiplegia and hemiparesis following cerebral infarction affecting right dominant side: Secondary | ICD-10-CM | POA: Diagnosis not present

## 2021-05-15 DIAGNOSIS — I69320 Aphasia following cerebral infarction: Secondary | ICD-10-CM | POA: Diagnosis not present

## 2021-05-15 DIAGNOSIS — R2681 Unsteadiness on feet: Secondary | ICD-10-CM | POA: Diagnosis not present

## 2021-05-15 DIAGNOSIS — M6281 Muscle weakness (generalized): Secondary | ICD-10-CM | POA: Diagnosis not present

## 2021-05-15 DIAGNOSIS — I69391 Dysphagia following cerebral infarction: Secondary | ICD-10-CM | POA: Diagnosis not present

## 2021-05-15 DIAGNOSIS — Z1159 Encounter for screening for other viral diseases: Secondary | ICD-10-CM | POA: Diagnosis not present

## 2021-05-16 DIAGNOSIS — I69351 Hemiplegia and hemiparesis following cerebral infarction affecting right dominant side: Secondary | ICD-10-CM | POA: Diagnosis not present

## 2021-05-16 DIAGNOSIS — M6281 Muscle weakness (generalized): Secondary | ICD-10-CM | POA: Diagnosis not present

## 2021-05-16 DIAGNOSIS — I69320 Aphasia following cerebral infarction: Secondary | ICD-10-CM | POA: Diagnosis not present

## 2021-05-16 DIAGNOSIS — R2681 Unsteadiness on feet: Secondary | ICD-10-CM | POA: Diagnosis not present

## 2021-05-16 DIAGNOSIS — Z1159 Encounter for screening for other viral diseases: Secondary | ICD-10-CM | POA: Diagnosis not present

## 2021-05-16 DIAGNOSIS — I69391 Dysphagia following cerebral infarction: Secondary | ICD-10-CM | POA: Diagnosis not present

## 2021-05-17 DIAGNOSIS — I69351 Hemiplegia and hemiparesis following cerebral infarction affecting right dominant side: Secondary | ICD-10-CM | POA: Diagnosis not present

## 2021-05-17 DIAGNOSIS — Z1159 Encounter for screening for other viral diseases: Secondary | ICD-10-CM | POA: Diagnosis not present

## 2021-05-17 DIAGNOSIS — I69391 Dysphagia following cerebral infarction: Secondary | ICD-10-CM | POA: Diagnosis not present

## 2021-05-17 DIAGNOSIS — M6281 Muscle weakness (generalized): Secondary | ICD-10-CM | POA: Diagnosis not present

## 2021-05-17 DIAGNOSIS — I69320 Aphasia following cerebral infarction: Secondary | ICD-10-CM | POA: Diagnosis not present

## 2021-05-17 DIAGNOSIS — R2681 Unsteadiness on feet: Secondary | ICD-10-CM | POA: Diagnosis not present

## 2021-05-18 DIAGNOSIS — I214 Non-ST elevation (NSTEMI) myocardial infarction: Secondary | ICD-10-CM | POA: Diagnosis not present

## 2021-05-18 DIAGNOSIS — I5181 Takotsubo syndrome: Secondary | ICD-10-CM | POA: Diagnosis not present

## 2021-05-18 DIAGNOSIS — I69351 Hemiplegia and hemiparesis following cerebral infarction affecting right dominant side: Secondary | ICD-10-CM | POA: Diagnosis not present

## 2021-05-18 DIAGNOSIS — M6281 Muscle weakness (generalized): Secondary | ICD-10-CM | POA: Diagnosis not present

## 2021-05-18 DIAGNOSIS — F32A Depression, unspecified: Secondary | ICD-10-CM | POA: Diagnosis not present

## 2021-05-18 DIAGNOSIS — R2681 Unsteadiness on feet: Secondary | ICD-10-CM | POA: Diagnosis not present

## 2021-05-18 DIAGNOSIS — I69391 Dysphagia following cerebral infarction: Secondary | ICD-10-CM | POA: Diagnosis not present

## 2021-05-18 DIAGNOSIS — Z1159 Encounter for screening for other viral diseases: Secondary | ICD-10-CM | POA: Diagnosis not present

## 2021-05-18 DIAGNOSIS — I69320 Aphasia following cerebral infarction: Secondary | ICD-10-CM | POA: Diagnosis not present

## 2021-05-23 DIAGNOSIS — M6281 Muscle weakness (generalized): Secondary | ICD-10-CM | POA: Diagnosis not present

## 2021-08-07 DIAGNOSIS — I6930 Unspecified sequelae of cerebral infarction: Secondary | ICD-10-CM | POA: Diagnosis not present

## 2021-09-20 DIAGNOSIS — I7091 Generalized atherosclerosis: Secondary | ICD-10-CM | POA: Diagnosis not present

## 2021-10-12 ENCOUNTER — Encounter (INDEPENDENT_AMBULATORY_CARE_PROVIDER_SITE_OTHER): Payer: Self-pay | Admitting: *Deleted

## 2021-10-30 DIAGNOSIS — I679 Cerebrovascular disease, unspecified: Secondary | ICD-10-CM | POA: Diagnosis not present

## 2021-10-30 DIAGNOSIS — K5900 Constipation, unspecified: Secondary | ICD-10-CM | POA: Diagnosis not present

## 2021-10-30 DIAGNOSIS — F32A Depression, unspecified: Secondary | ICD-10-CM | POA: Diagnosis not present

## 2021-11-06 ENCOUNTER — Other Ambulatory Visit: Payer: Self-pay | Admitting: *Deleted

## 2021-11-06 DIAGNOSIS — Z961 Presence of intraocular lens: Secondary | ICD-10-CM | POA: Diagnosis not present

## 2021-11-06 DIAGNOSIS — H524 Presbyopia: Secondary | ICD-10-CM | POA: Diagnosis not present

## 2021-11-06 NOTE — Patient Outreach (Signed)
  Care Coordination   11/06/2021 Name: Bonnie Schneider MRN: [id number] DOB: [birth year]   Care Coordination Outreach Attempts:  An unsuccessful telephone outreach was attempted today to offer the patient information about available care coordination services as a benefit of their health plan.   Follow Up Plan:  No further outreach attempts will be made at this time. We have been unable to contact the patient to offer or enroll patient in care coordination services  Encounter Outcome:  No Answer  Care Coordination Interventions Activated:  No   Care Coordination Interventions:  No, not indicated    Valente David, RN, MSN, [facility] [phone]

## 2021-11-15 DIAGNOSIS — I5181 Takotsubo syndrome: Secondary | ICD-10-CM | POA: Diagnosis not present

## 2021-11-15 DIAGNOSIS — I6522 Occlusion and stenosis of left carotid artery: Secondary | ICD-10-CM | POA: Diagnosis not present

## 2021-11-15 DIAGNOSIS — I10 Essential (primary) hypertension: Secondary | ICD-10-CM | POA: Diagnosis not present

## 2021-11-15 DIAGNOSIS — Z8673 Personal history of transient ischemic attack (TIA), and cerebral infarction without residual deficits: Secondary | ICD-10-CM | POA: Diagnosis not present

## 2021-11-19 DIAGNOSIS — R1312 Dysphagia, oropharyngeal phase: Secondary | ICD-10-CM | POA: Diagnosis not present

## 2021-11-22 DIAGNOSIS — R1312 Dysphagia, oropharyngeal phase: Secondary | ICD-10-CM | POA: Diagnosis not present

## 2021-11-23 DIAGNOSIS — R1312 Dysphagia, oropharyngeal phase: Secondary | ICD-10-CM | POA: Diagnosis not present

## 2021-11-23 DIAGNOSIS — I639 Cerebral infarction, unspecified: Secondary | ICD-10-CM | POA: Diagnosis not present

## 2021-11-23 DIAGNOSIS — Z8673 Personal history of transient ischemic attack (TIA), and cerebral infarction without residual deficits: Secondary | ICD-10-CM | POA: Diagnosis not present

## 2021-11-23 DIAGNOSIS — I6522 Occlusion and stenosis of left carotid artery: Secondary | ICD-10-CM | POA: Diagnosis not present

## 2021-11-23 DIAGNOSIS — I10 Essential (primary) hypertension: Secondary | ICD-10-CM | POA: Diagnosis not present

## 2021-11-25 DIAGNOSIS — R1312 Dysphagia, oropharyngeal phase: Secondary | ICD-10-CM | POA: Diagnosis not present

## 2021-11-26 DIAGNOSIS — R1312 Dysphagia, oropharyngeal phase: Secondary | ICD-10-CM | POA: Diagnosis not present

## 2021-11-27 DIAGNOSIS — R1312 Dysphagia, oropharyngeal phase: Secondary | ICD-10-CM | POA: Diagnosis not present

## 2021-11-28 DIAGNOSIS — R1312 Dysphagia, oropharyngeal phase: Secondary | ICD-10-CM | POA: Diagnosis not present

## 2021-11-29 DIAGNOSIS — R1312 Dysphagia, oropharyngeal phase: Secondary | ICD-10-CM | POA: Diagnosis not present

## 2021-12-04 DIAGNOSIS — R1312 Dysphagia, oropharyngeal phase: Secondary | ICD-10-CM | POA: Diagnosis not present

## 2021-12-05 DIAGNOSIS — R1312 Dysphagia, oropharyngeal phase: Secondary | ICD-10-CM | POA: Diagnosis not present

## 2021-12-07 DIAGNOSIS — R1312 Dysphagia, oropharyngeal phase: Secondary | ICD-10-CM | POA: Diagnosis not present

## 2021-12-08 DIAGNOSIS — R1312 Dysphagia, oropharyngeal phase: Secondary | ICD-10-CM | POA: Diagnosis not present

## 2021-12-10 DIAGNOSIS — R1312 Dysphagia, oropharyngeal phase: Secondary | ICD-10-CM | POA: Diagnosis not present

## 2021-12-11 DIAGNOSIS — R1312 Dysphagia, oropharyngeal phase: Secondary | ICD-10-CM | POA: Diagnosis not present

## 2021-12-13 DIAGNOSIS — R1312 Dysphagia, oropharyngeal phase: Secondary | ICD-10-CM | POA: Diagnosis not present

## 2021-12-17 DIAGNOSIS — R1312 Dysphagia, oropharyngeal phase: Secondary | ICD-10-CM | POA: Diagnosis not present

## 2021-12-17 DIAGNOSIS — I6522 Occlusion and stenosis of left carotid artery: Secondary | ICD-10-CM | POA: Diagnosis not present

## 2021-12-22 DIAGNOSIS — R1312 Dysphagia, oropharyngeal phase: Secondary | ICD-10-CM | POA: Diagnosis not present

## 2021-12-24 DIAGNOSIS — R1312 Dysphagia, oropharyngeal phase: Secondary | ICD-10-CM | POA: Diagnosis not present

## 2021-12-25 DIAGNOSIS — I7091 Generalized atherosclerosis: Secondary | ICD-10-CM | POA: Diagnosis not present

## 2021-12-25 DIAGNOSIS — R1312 Dysphagia, oropharyngeal phase: Secondary | ICD-10-CM | POA: Diagnosis not present

## 2021-12-25 DIAGNOSIS — B351 Tinea unguium: Secondary | ICD-10-CM | POA: Diagnosis not present

## 2021-12-27 DIAGNOSIS — R1312 Dysphagia, oropharyngeal phase: Secondary | ICD-10-CM | POA: Diagnosis not present

## 2021-12-31 DIAGNOSIS — R1312 Dysphagia, oropharyngeal phase: Secondary | ICD-10-CM | POA: Diagnosis not present

## 2022-01-01 DIAGNOSIS — R1312 Dysphagia, oropharyngeal phase: Secondary | ICD-10-CM | POA: Diagnosis not present

## 2022-01-02 DIAGNOSIS — R1312 Dysphagia, oropharyngeal phase: Secondary | ICD-10-CM | POA: Diagnosis not present

## 2022-01-02 DIAGNOSIS — E785 Hyperlipidemia, unspecified: Secondary | ICD-10-CM | POA: Diagnosis not present

## 2022-01-02 DIAGNOSIS — I679 Cerebrovascular disease, unspecified: Secondary | ICD-10-CM | POA: Diagnosis not present

## 2022-01-02 DIAGNOSIS — F32A Depression, unspecified: Secondary | ICD-10-CM | POA: Diagnosis not present

## 2022-01-09 DIAGNOSIS — R1312 Dysphagia, oropharyngeal phase: Secondary | ICD-10-CM | POA: Diagnosis not present

## 2022-01-10 DIAGNOSIS — R1312 Dysphagia, oropharyngeal phase: Secondary | ICD-10-CM | POA: Diagnosis not present

## 2022-01-14 DIAGNOSIS — R1312 Dysphagia, oropharyngeal phase: Secondary | ICD-10-CM | POA: Diagnosis not present

## 2022-01-15 DIAGNOSIS — I679 Cerebrovascular disease, unspecified: Secondary | ICD-10-CM | POA: Diagnosis not present

## 2022-01-15 DIAGNOSIS — E785 Hyperlipidemia, unspecified: Secondary | ICD-10-CM | POA: Diagnosis not present

## 2022-01-15 DIAGNOSIS — I2510 Atherosclerotic heart disease of native coronary artery without angina pectoris: Secondary | ICD-10-CM | POA: Diagnosis not present

## 2022-01-18 DIAGNOSIS — R1312 Dysphagia, oropharyngeal phase: Secondary | ICD-10-CM | POA: Diagnosis not present

## 2022-01-19 DIAGNOSIS — R1312 Dysphagia, oropharyngeal phase: Secondary | ICD-10-CM | POA: Diagnosis not present

## 2022-01-22 DIAGNOSIS — R1312 Dysphagia, oropharyngeal phase: Secondary | ICD-10-CM | POA: Diagnosis not present

## 2022-01-23 DIAGNOSIS — R1312 Dysphagia, oropharyngeal phase: Secondary | ICD-10-CM | POA: Diagnosis not present

## 2022-01-24 DIAGNOSIS — R1312 Dysphagia, oropharyngeal phase: Secondary | ICD-10-CM | POA: Diagnosis not present

## 2022-01-25 DIAGNOSIS — R1312 Dysphagia, oropharyngeal phase: Secondary | ICD-10-CM | POA: Diagnosis not present

## 2022-01-27 DIAGNOSIS — R1312 Dysphagia, oropharyngeal phase: Secondary | ICD-10-CM | POA: Diagnosis not present

## 2022-01-28 DIAGNOSIS — R1312 Dysphagia, oropharyngeal phase: Secondary | ICD-10-CM | POA: Diagnosis not present

## 2022-01-29 DIAGNOSIS — R1312 Dysphagia, oropharyngeal phase: Secondary | ICD-10-CM | POA: Diagnosis not present

## 2022-03-20 DIAGNOSIS — E785 Hyperlipidemia, unspecified: Secondary | ICD-10-CM | POA: Diagnosis not present

## 2022-03-20 DIAGNOSIS — I2510 Atherosclerotic heart disease of native coronary artery without angina pectoris: Secondary | ICD-10-CM | POA: Diagnosis not present

## 2022-03-20 DIAGNOSIS — I679 Cerebrovascular disease, unspecified: Secondary | ICD-10-CM | POA: Diagnosis not present

## 2022-03-20 DIAGNOSIS — F32A Depression, unspecified: Secondary | ICD-10-CM | POA: Diagnosis not present

## 2022-03-22 DIAGNOSIS — I10 Essential (primary) hypertension: Secondary | ICD-10-CM | POA: Diagnosis not present

## 2022-03-22 DIAGNOSIS — R030 Elevated blood-pressure reading, without diagnosis of hypertension: Secondary | ICD-10-CM | POA: Diagnosis not present

## 2022-04-03 DIAGNOSIS — I10 Essential (primary) hypertension: Secondary | ICD-10-CM | POA: Diagnosis not present

## 2022-04-03 DIAGNOSIS — R030 Elevated blood-pressure reading, without diagnosis of hypertension: Secondary | ICD-10-CM | POA: Diagnosis not present

## 2022-04-10 DIAGNOSIS — I7091 Generalized atherosclerosis: Secondary | ICD-10-CM | POA: Diagnosis not present

## 2022-04-10 DIAGNOSIS — B351 Tinea unguium: Secondary | ICD-10-CM | POA: Diagnosis not present

## 2022-04-15 DIAGNOSIS — I10 Essential (primary) hypertension: Secondary | ICD-10-CM | POA: Diagnosis not present

## 2022-04-15 DIAGNOSIS — Z79899 Other long term (current) drug therapy: Secondary | ICD-10-CM | POA: Diagnosis not present

## 2022-04-30 DIAGNOSIS — M6281 Muscle weakness (generalized): Secondary | ICD-10-CM | POA: Diagnosis not present

## 2022-05-01 DIAGNOSIS — M6281 Muscle weakness (generalized): Secondary | ICD-10-CM | POA: Diagnosis not present

## 2022-05-02 DIAGNOSIS — M6281 Muscle weakness (generalized): Secondary | ICD-10-CM | POA: Diagnosis not present

## 2022-05-03 DIAGNOSIS — M6281 Muscle weakness (generalized): Secondary | ICD-10-CM | POA: Diagnosis not present

## 2022-05-06 DIAGNOSIS — M6281 Muscle weakness (generalized): Secondary | ICD-10-CM | POA: Diagnosis not present

## 2022-05-07 DIAGNOSIS — M6281 Muscle weakness (generalized): Secondary | ICD-10-CM | POA: Diagnosis not present

## 2022-05-08 DIAGNOSIS — M6281 Muscle weakness (generalized): Secondary | ICD-10-CM | POA: Diagnosis not present

## 2022-05-13 DIAGNOSIS — M6281 Muscle weakness (generalized): Secondary | ICD-10-CM | POA: Diagnosis not present

## 2022-05-17 DIAGNOSIS — M6281 Muscle weakness (generalized): Secondary | ICD-10-CM | POA: Diagnosis not present

## 2022-05-20 DIAGNOSIS — M6281 Muscle weakness (generalized): Secondary | ICD-10-CM | POA: Diagnosis not present

## 2022-05-21 DIAGNOSIS — I2510 Atherosclerotic heart disease of native coronary artery without angina pectoris: Secondary | ICD-10-CM | POA: Diagnosis not present

## 2022-05-21 DIAGNOSIS — E785 Hyperlipidemia, unspecified: Secondary | ICD-10-CM | POA: Diagnosis not present

## 2022-05-21 DIAGNOSIS — I679 Cerebrovascular disease, unspecified: Secondary | ICD-10-CM | POA: Diagnosis not present

## 2022-07-11 DIAGNOSIS — E785 Hyperlipidemia, unspecified: Secondary | ICD-10-CM | POA: Diagnosis not present

## 2022-07-25 DIAGNOSIS — E785 Hyperlipidemia, unspecified: Secondary | ICD-10-CM | POA: Diagnosis not present

## 2022-07-25 DIAGNOSIS — I2510 Atherosclerotic heart disease of native coronary artery without angina pectoris: Secondary | ICD-10-CM | POA: Diagnosis not present

## 2022-07-25 DIAGNOSIS — I679 Cerebrovascular disease, unspecified: Secondary | ICD-10-CM | POA: Diagnosis not present

## 2022-09-17 ENCOUNTER — Encounter: Payer: Self-pay | Admitting: Emergency Medicine

## 2022-09-17 ENCOUNTER — Emergency Department
Admission: EM | Admit: 2022-09-17 | Discharge: 2022-10-29 | Disposition: A | Discharge status: Home / Self Care | Payer: 59 | Attending: Emergency Medicine | Admitting: Emergency Medicine

## 2022-09-17 ENCOUNTER — Other Ambulatory Visit: Payer: Self-pay

## 2022-09-17 DIAGNOSIS — R462 Strange and inexplicable behavior: Secondary | ICD-10-CM | POA: Insufficient documentation

## 2022-09-17 DIAGNOSIS — J449 Chronic obstructive pulmonary disease, unspecified: Secondary | ICD-10-CM | POA: Insufficient documentation

## 2022-09-17 DIAGNOSIS — I10 Essential (primary) hypertension: Secondary | ICD-10-CM | POA: Insufficient documentation

## 2022-09-17 DIAGNOSIS — Z8673 Personal history of transient ischemic attack (TIA), and cerebral infarction without residual deficits: Secondary | ICD-10-CM | POA: Diagnosis not present

## 2022-09-17 DIAGNOSIS — R4689 Other symptoms and signs involving appearance and behavior: Secondary | ICD-10-CM

## 2022-09-17 DIAGNOSIS — D72829 Elevated white blood cell count, unspecified: Secondary | ICD-10-CM | POA: Diagnosis not present

## 2022-09-17 HISTORY — DX: Vascular dementia, unspecified severity, without behavioral disturbance, psychotic disturbance, mood disturbance, and anxiety: F01.50

## 2022-09-17 LAB — COMPREHENSIVE METABOLIC PANEL
ALT: 11 U/L (ref 0–44)
AST: 14 U/L — ABNORMAL LOW (ref 15–41)
Albumin: 3.8 g/dL (ref 3.5–5.0)
Alkaline Phosphatase: 98 U/L (ref 38–126)
Anion gap: 11 (ref 5–15)
BUN: 10 mg/dL (ref 8–23)
CO2: 24 mmol/L (ref 22–32)
Calcium: 8.9 mg/dL (ref 8.9–10.3)
Chloride: 105 mmol/L (ref 98–111)
Creatinine, Ser: 0.45 mg/dL (ref 0.44–1.00)
GFR, Estimated: >60 mL/min (ref >60)
Glucose, Bld: 120 mg/dL — ABNORMAL HIGH (ref 70–99)
Potassium: 3.1 mmol/L — ABNORMAL LOW (ref 3.5–5.1)
Sodium: 140 mmol/L (ref 135–145)
Total Bilirubin: 1.3 mg/dL — ABNORMAL HIGH (ref 0.3–1.2)
Total Protein: 7.1 g/dL (ref 6.5–8.1)

## 2022-09-17 LAB — URINALYSIS, ROUTINE W REFLEX MICROSCOPIC
Bilirubin Urine: NEGATIVE
Glucose, UA: NEGATIVE mg/dL
Hgb urine dipstick: NEGATIVE
Ketones, ur: NEGATIVE mg/dL
Nitrite: NEGATIVE
Protein, ur: NEGATIVE mg/dL
Specific Gravity, Urine: 1.012 (ref 1.005–1.030)
pH: 6 (ref 5.0–8.0)

## 2022-09-17 LAB — CBC
HCT: 40.6 % (ref 36.0–46.0)
Hemoglobin: 13.8 g/dL (ref 12.0–15.0)
MCH: 29.2 pg (ref 26.0–34.0)
MCHC: 34 g/dL (ref 30.0–36.0)
MCV: 86 fL (ref 80.0–100.0)
Platelets: 304 10*3/uL (ref 150–400)
RBC: 4.72 MIL/uL (ref 3.87–5.11)
RDW: 13.1 % (ref 11.5–15.5)
WBC: 12.4 10*3/uL — ABNORMAL HIGH (ref 4.0–10.5)
nRBC: 0 % (ref 0.0–0.2)

## 2022-09-17 LAB — URINE DRUG SCREEN, QUALITATIVE (ARMC ONLY)
Amphetamines, Ur Screen: NOT DETECTED
Barbiturates, Ur Screen: NOT DETECTED
Benzodiazepine, Ur Scrn: NOT DETECTED
Cannabinoid 50 Ng, Ur ~~LOC~~: NOT DETECTED
Cocaine Metabolite,Ur ~~LOC~~: NOT DETECTED
MDMA (Ecstasy)Ur Screen: NOT DETECTED
Methadone Scn, Ur: NOT DETECTED
Opiate, Ur Screen: NOT DETECTED
Phencyclidine (PCP) Ur S: NOT DETECTED
Tricyclic, Ur Screen: NOT DETECTED

## 2022-09-17 LAB — ETHANOL: Alcohol, Ethyl (B): <10 mg/dL (ref <10)

## 2022-09-17 LAB — ACETAMINOPHEN LEVEL: Acetaminophen (Tylenol), Serum: <10 ug/mL — ABNORMAL LOW (ref 10–30)

## 2022-09-17 LAB — SALICYLATE LEVEL: Salicylate Lvl: <7 mg/dL — ABNORMAL LOW (ref 7.0–30.0)

## 2022-09-17 MED ORDER — POTASSIUM CHLORIDE CRYS ER 20 MEQ PO TBCR
40.0000 meq | EXTENDED_RELEASE_TABLET | Freq: Once | ORAL | Status: DC
  Filled 2022-09-17: qty 2
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
## 2022-09-17 NOTE — ED Provider Notes (Signed)
Patient was seen by psychiatry and has been cleared medically.  Urinalysis is 21-50 white cells but also 21-50 squames.  My assessment patient has no urinary symptoms so I think this is likely contaminant and will not treat based off this UA.  She has received p.o. potassium for potassium 3.1.  She is medically cleared at this time.   Georga Hacking, MD 09/17/22 1329

## 2022-09-17 NOTE — ED Notes (Signed)
This RN attempted to call the guardian, stephanie byrd [phone], message left

## 2022-09-17 NOTE — ED Notes (Signed)
Report to Fleet Contras, RN, pt moved to 1H
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
## 2022-09-17 NOTE — ED Notes (Signed)
Pt dressed out by this RN and EDTs Sharla Kidney and [place]) belongings include:  Black shoes Blue top [employer]

## 2022-09-17 NOTE — ED Notes (Signed)
VOL  

## 2022-09-17 NOTE — BH Assessment (Signed)
Writer called legal guardian, Rebecka Apley [phone] and left message to return call.

## 2022-09-17 NOTE — ED Notes (Signed)
Pt med list that was in chart is tubed to pharmacy at this time for med rec.

## 2022-09-17 NOTE — ED Notes (Signed)
IVC Rescinded/ Per Cranston Neighbor, NP, pt does not meet criteria for inpatient psychiatric admission and can return to [facility].

## 2022-09-17 NOTE — ED Notes (Signed)
Pt states that she couldn't eat her lunch and didn't want the tray

## 2022-09-17 NOTE — ED Notes (Signed)
Pt given her breakfast tray.  
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
## 2022-09-17 NOTE — ED Notes (Signed)
Lunch tray was given with drink.

## 2022-09-18 NOTE — ED Notes (Signed)
Pt taken to decon shower after peeing in bed. Pt showered off. Pt given new scrubs.

## 2022-09-18 NOTE — ED Notes (Signed)
PT  VOL °

## 2022-09-18 NOTE — ED Notes (Signed)
Patient alert, calm and cooperative. Denies needs. Patient clean and dry and denies need to go to the bathroom at this time.

## 2022-09-19 NOTE — ED Notes (Signed)
VOL  

## 2022-09-19 NOTE — ED Notes (Signed)
Patient resting in bed free from sign of distress. Breathing unlabored speaking in full sentences with symmetric chest rise and fall. Bed low and locked with side rails raised x2. Call bell in reach  

## 2022-09-19 NOTE — ED Notes (Signed)
Judeth Cornfield with DSS in ED to evaluate patient and receive update; she spoke with [facility] and they are discharging the patient due to her behavior as a safety risk. Morrie Sheldon, Child psychotherapist notified.

## 2022-09-19 NOTE — ED Provider Notes (Signed)
-----------------------------------------   5:42 AM on 09/19/2022 -----------------------------------------   Blood pressure (!) 144/76, pulse 72, temperature 97.8 F (36.6 C), temperature source Oral, resp. rate 18, height 5\' 1"  (1.549 m), weight 43.1 kg, SpO2 98 %.  The patient is calm and cooperative at this time.  There have been no acute events since the last update.  Awaiting disposition plan from Social Work team.   Irean Hong, MD 09/19/22 [phone]

## 2022-09-19 NOTE — ED Notes (Signed)
vol

## 2022-09-20 DIAGNOSIS — R462 Strange and inexplicable behavior: Secondary | ICD-10-CM | POA: Diagnosis not present

## 2022-09-20 MED ORDER — ROSUVASTATIN CALCIUM 20 MG PO TABS
40.0000 mg | ORAL_TABLET | Freq: Every day | ORAL | Status: DC
  Filled 2022-09-20 (×39): qty 2

## 2022-09-20 MED ORDER — AMITRIPTYLINE HCL 50 MG PO TABS
150.0000 mg | ORAL_TABLET | Freq: Every day | ORAL | Status: DC
  Filled 2022-09-20 (×3): qty 3

## 2022-09-20 MED ORDER — CLOPIDOGREL BISULFATE 75 MG PO TABS
75.0000 mg | ORAL_TABLET | Freq: Every day | ORAL | Status: DC
  Administered 2022-09-20 – 2022-09-21 (×2): 75 mg via ORAL
  Filled 2022-09-20 (×5): qty 1

## 2022-09-20 MED ORDER — SENNOSIDES-DOCUSATE SODIUM 8.6-50 MG PO TABS
2.0000 | ORAL_TABLET | Freq: Two times a day (BID) | ORAL | Status: DC
  Administered 2022-09-20 – 2022-09-21 (×2): 2 via ORAL
  Filled 2022-09-20 (×8): qty 2

## 2022-09-20 MED ORDER — AMLODIPINE BESYLATE 5 MG PO TABS
10.0000 mg | ORAL_TABLET | Freq: Every day | ORAL | Status: DC
  Administered 2022-09-20 – 2022-09-21 (×2): 10 mg via ORAL
  Filled 2022-09-20 (×5): qty 2

## 2022-09-20 MED ORDER — CLONIDINE HCL 0.1 MG PO TABS
0.1000 mg | ORAL_TABLET | Freq: Once | ORAL | Status: DC
  Filled 2022-09-20: qty 1

## 2022-09-20 MED ORDER — ASPIRIN 81 MG PO CHEW
81.0000 mg | CHEWABLE_TABLET | Freq: Every day | ORAL | Status: DC
  Administered 2022-09-20 – 2022-09-21 (×2): 81 mg via ORAL
  Filled 2022-09-20 (×5): qty 1

## 2022-09-20 NOTE — ED Notes (Signed)
Pt has had elevated BP. Last BP 241/94. EDP ordered oral blood pressure medication. Medication was offered twice to pt. Pt continues to refused medication. EDP [place] informed of pts refusal.

## 2022-09-20 NOTE — ED Notes (Signed)
[place] ice push pop given, pt has about 50% until she requests it be thrown out.  Please note that pt has refused all 3 meal trays today and became agitated at the reminders to eat, she threw the ice creams as well as bagel I gave her away in the trashcan on the hall.  New scrubs were given but pt declined changing.  Pt did drink two cokes.

## 2022-09-20 NOTE — ED Notes (Signed)
Pt is calm and cooperative. Walked with pt to and from the bathroom. Pt refused dinner and snack. Was offer water which she also refused.

## 2022-09-20 NOTE — ED Notes (Signed)
Pt is sleeping, has blankets.  No distress

## 2022-09-20 NOTE — NC FL2 (Signed)
[place] MEDICAID FL2 LEVEL OF CARE FORM     IDENTIFICATION  Patient Name: Bonnie Schneider Birthdate: Jun 25, 1955 Sex: female Admission Date (Current Location): 09/17/2022  [facility] and [place] Number:  Chiropodist and Address:  [facility], [address] [place], [place] [address]      Provider Number: [id number]  Attending Physician Name and Address:  No att. providers found  Relative Name and Phone Number:  Judeth Cornfield  [phone]    Current Level of Care: Hospital Recommended Level of Care: Skilled Nursing Facility Prior Approval Number:    Date Approved/Denied:   PASRR Number: [id number]  Discharge Plan: SNF    Current Diagnoses: Patient Active Problem List   Diagnosis Date Noted   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   Hypocortisolemia (HCC) 02/19/2016   Hypertension 10/21/2011   Chronic back pain 10/21/2011   Encounter for screening colonoscopy 10/21/2011   Bloating 10/21/2011    Orientation RESPIRATION BLADDER Height & Weight     Self, Place  Normal Incontinent Weight: 95 lb (43.1 kg) Height:  5\' 1"  (154.9 cm)  BEHAVIORAL SYMPTOMS/MOOD NEUROLOGICAL BOWEL NUTRITION STATUS      Continent Diet (see discharge summary)  AMBULATORY STATUS COMMUNICATION OF NEEDS Skin   Limited Assist Verbally Normal                       Personal Care Assistance Level of Assistance  Bathing, Dressing, Feeding, Total care Bathing Assistance: Limited assistance Feeding assistance: Limited assistance Dressing Assistance: Limited assistance Total Care Assistance: Limited assistance   Functional Limitations Info  Sight, Hearing, Speech Sight Info: Adequate Hearing Info: Adequate Speech Info: Adequate    SPECIAL CARE FACTORS FREQUENCY                       Contractures Contractures Info: Not present    Additional Factors Info  Code Status, Allergies Code Status Info: full Allergies Info: NKA           Current  Medications (09/20/2022):  This is the current hospital active medication list Current Facility-Administered Medications  Medication Dose Route Frequency Provider Last Rate Last Admin   amitriptyline (ELAVIL) tablet 150 mg  150 mg Oral QHS Delton Prairie, MD       aspirin chewable tablet 81 mg  81 mg Oral Daily Delton Prairie, MD       clopidogrel (PLAVIX) tablet 75 mg  75 mg Oral Daily Delton Prairie, MD       potassium chloride SA (KLOR-CON M) CR tablet 40 mEq  40 mEq Oral Once Sharyn Creamer, MD       rosuvastatin (CRESTOR) tablet 40 mg  40 mg Oral QHS Delton Prairie, MD       senna-docusate (Senokot-S) tablet 2 tablet  2 tablet Oral BID Delton Prairie, MD       Current Outpatient Medications  Medication Sig Dispense Refill   aspirin 81 MG chewable tablet Chew 81 mg by mouth daily.     clopidogrel (PLAVIX) 75 MG tablet Take 75 mg by mouth daily.     mirtazapine (REMERON) 7.5 MG tablet Take 7.5 mg by mouth at bedtime.     rosuvastatin (CRESTOR) 40 MG tablet Take 40 mg by mouth at bedtime.     senna-docusate (SENOKOT-S) 8.6-50 MG tablet Take 2 tablets by mouth 2 (two) times daily.     alprazolam (XANAX) 2 MG tablet Take 2 mg by mouth 4 (four) times daily. Anxiety (Patient not  taking: Reported on 09/17/2022)     amitriptyline (ELAVIL) 50 MG tablet Take 150 mg by mouth at bedtime.  (Patient not taking: Reported on 09/17/2022)     enalapril (VASOTEC) 20 MG tablet TAKE 1 TABLET BY MOUTH EVERY DAY (Patient not taking: Reported on 09/17/2022) 30 tablet 0   furosemide (LASIX) 20 MG tablet Take 20 mg by mouth daily as needed for fluid.  (Patient not taking: Reported on 09/17/2022)     hydrochlorothiazide (HYDRODIURIL) 25 MG tablet Take 25 mg by mouth daily. (Patient not taking: Reported on 09/17/2022)     HYDROcodone-acetaminophen (NORCO) 10-325 MG per tablet Take 1 tablet by mouth every 6 (six) hours as needed for pain. (Patient not taking: Reported on 09/17/2022)     Omega-3 Fatty Acids (FISH OIL) 1000 MG CAPS Take  3,000 mg by mouth daily. (Patient not taking: Reported on 09/17/2022)     pantoprazole (PROTONIX) 40 MG tablet Take 40 mg by mouth daily. (Patient not taking: Reported on 09/17/2022)     rizatriptan (MAXALT) 10 MG tablet Take 10 mg by mouth as needed. May repeat in 2 hours if needed (Patient not taking: Reported on 09/17/2022)     traZODone (DESYREL) 100 MG tablet Take 100 mg by mouth at bedtime. (Patient not taking: Reported on 09/17/2022)       Discharge Medications: Please see discharge summary for a list of discharge medications.  Relevant Imaging Results:  Relevant Lab Results:   Additional Information SSN: [id number]  Darolyn Rua, LCSW

## 2022-09-20 NOTE — ED Notes (Signed)
CRITICAL VALUE STICKER  CRITICAL VALUE: Repeat BP 241/94   DATE & TIME NOTIFIED: 09/20/2022 1939   MD NOTIFIED: Filomena Jungling   TIME OF NOTIFICATION: Face to Fact 1939   RESPONSE: No additional orders

## 2022-09-20 NOTE — ED Provider Notes (Signed)
Emergency Medicine Observation Re-evaluation Note  Bonnie Schneider is a 67 y.o. female, seen on rounds today.  Pt initially presented to the ED for complaints of Psychiatric Evaluation  Currently, the patient is resting in bed. No reported issues overnight from nursing team.   Physical Exam  BP (!) 155/72 (BP Location: Left Arm)   Pulse 69   Temp 98.4 F (36.9 C) (Oral)   Resp 15   Ht 5\' 1"  (1.549 m)   Wt 43.1 kg   SpO2 95%   BMI 17.95 kg/m  Physical Exam General: Resting comfortably in bed  ED Course / MDM    Plan  Current plan is for dispo per social work.    Trinna Post, MD 09/20/22 [phone]

## 2022-09-20 NOTE — ED Notes (Signed)
Pt ambulated to the bathroom to void.   

## 2022-09-20 NOTE — TOC Initial Note (Addendum)
Transition of Care [facility]) - Initial/Assessment Note    Patient Details  Name: Bonnie Schneider MRN: [id number] Date of Birth: [birth year]  Transition of Care [facility]) CM/SW Contact:    Darolyn Rua, LCSW Phone Number: 09/20/2022, 9:07 AM  Clinical Narrative:                  CSW spoke with DSS social worker Rebecka Apley at [phone] Ext [phone]  Per DSS social worker they have guardianship, she reports patient was in [place] when they first became involved and became her guardian in Jan 2023.   Patient was at [facility], is not able to return due to reported behaviors of taking nasal cannula off of roommate and put a pillow over . They report this is not normal behavior and patient has had no psych history or past behaviors noted.   DSS social worker is requesting fl2 be sent out to facilities for long term care placement, reports patient does have long term care medicaid.   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   Expected Discharge Plan: Skilled Nursing Facility Barriers to Discharge: Continued Medical Work up   Patient Goals and CMS Choice Patient states their goals for this hospitalization and ongoing recovery are:: to go home CMS Medicare.gov Compare Post Acute Care list provided to:: Legal Guardian (dss social worker)        Expected Discharge Plan and Services       Living arrangements for the past 2 months: Skilled Nursing Facility                                      Prior Living Arrangements/Services Living arrangements for the past 2 months: Skilled Nursing Facility Lives with:: Facility Resident                   Activities of Daily Living      Permission Sought/Granted                  Emotional Assessment              Admission diagnosis:  beh med eval IVC Patient Active Problem List   Diagnosis Date Noted   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   Hypocortisolemia  (HCC) 02/19/2016   Hypertension 10/21/2011   Chronic back pain 10/21/2011   Encounter for screening colonoscopy 10/21/2011   Bloating 10/21/2011   PCP:  Elfredia Nevins, MD Pharmacy:   [facility] - [place], [place] - [address] [place] [address] Phone: [phone] Fax: [phone]     Social Determinants of Health (SDOH) Social History: SDOH Screenings   Tobacco Use: High Risk (09/18/2022)   SDOH Interventions:     Readmission Risk Interventions     No data to display

## 2022-09-20 NOTE — ED Notes (Signed)
Pt ambulated to the bathroom to void, she requests that the lunch tray be removed and she appears agitated about the food. As pt does not have dentures I brought her ice cream and encouraged her to eat, pt becomes agitated by this and has me remove food.  Pt resting calmly after this.

## 2022-09-20 NOTE — ED Notes (Signed)
Pt was given ice water, a bagel with jelly as a snack and po intake encouraged.  New scrubs given

## 2022-09-21 DIAGNOSIS — R462 Strange and inexplicable behavior: Secondary | ICD-10-CM | POA: Diagnosis not present

## 2022-09-21 MED ORDER — CLONIDINE HCL 0.1 MG PO TABS
0.1000 mg | ORAL_TABLET | Freq: Once | ORAL | Status: DC
  Filled 2022-09-21: qty 1

## 2022-09-21 NOTE — ED Notes (Signed)
TOC

## 2022-09-21 NOTE — ED Notes (Signed)
Pt in bed, offered pt shower or bathing supplies, pt states that she doesn't want to take a shower, offered lotion or deodorant, pt states that she is ok, new under ware and scrubs given, pt changing and seeing to ADLs, offered tooth brush, pt explained that she doesn't have any teeth, informed pt that if she needed anything or changed her mind and would like a shower or bathing wipes I would be happy to supply them.  Privacy given so pt can change into clean clothing.  Pt has no requests at this time

## 2022-09-21 NOTE — ED Notes (Signed)
Pt sitting up in bed, pt is confused, pt knows first name only, re oriented pt, pt has no requests, meal tray given, pt normo tensive md notified, clonidine not given, pt resps are even and unlabored.

## 2022-09-21 NOTE — ED Notes (Signed)
Pt ambulated to restroom independently while this RN walked beside pt. Pt noted to have steady gait, no difficulty walking.

## 2022-09-21 NOTE — ED Notes (Signed)
Pt ambulatory to bathroom with steady gait.

## 2022-09-21 NOTE — ED Notes (Signed)
Pt given meal tray at this time and drink 

## 2022-09-21 NOTE — ED Notes (Signed)
Pt ambulated to restroom independently while this RN walked beside her, pt given warm blankets and returned to bed.

## 2022-09-21 NOTE — ED Notes (Signed)
Talked to pt about dinner, reviewed menu with pt, pt requests a cheese pizza, meal ordered.

## 2022-09-21 NOTE — ED Provider Notes (Addendum)
Emergency Medicine Observation Re-evaluation Note  Bonnie Schneider is a 67 y.o. female, currently boarding in the emergency department.  Patient noted to be quite hypertensive today.  Blood pressure appears to have been trending up.  Patient's home medications have been reordered including amlodipine.  We will dose a one-time dose of clonidine and reassess blood pressure.  Physical Exam  BP (!) 241/94 (BP Location: Left Arm)   Pulse 65   Temp 98.1 F (36.7 C) (Oral)   Resp 19   Ht 5\' 1"  (1.549 m)   Wt 43.1 kg   SpO2 99%   BMI 17.95 kg/m    ED Course / MDM   Lab work from 4 days ago shows a overall reassuring urinalysis, reassuring chemistry negative acetaminophen and salicylate levels, reassuring CBC, drug screen negative, negative alcohol level.  Plan  Current plan is for placement to an appropriate living facility once available.  Social worker is currently working with the patient to achieve this.Bonnie Antis, MD 09/21/22 [phone]   Patient's blood pressure currently 129/88 on recheck prior to clonidine administration.  I believe the very high blood pressure reading was likely inaccurate.  We will hold off on clonidine but continue with the patient's normal blood pressure and daily medications.   Bonnie Antis, MD 09/21/22 [phone]

## 2022-09-21 NOTE — ED Notes (Signed)
Patient ambulated to restroom with steady gait.

## 2022-09-21 NOTE — ED Notes (Signed)
Pt had incontinence episode while sleeping. She was irritable with staff. After several minutes of attempting to convinced pt to change wet sheets and clothes, pt complied. Pt was able to walk to the bathroom. New clean clothes provided. Pt refused ot wear pant and socks.

## 2022-09-22 NOTE — ED Notes (Signed)
Pt is refusing vital signs at this time, denies wanting anything to drink or eat. Offered to find a book or something for her and she stated she didn't want anything.

## 2022-09-22 NOTE — ED Notes (Signed)
Patient refusing lunch tray; offered a menu to order something else but patient refused and stated "I'm not hungry."

## 2022-09-22 NOTE — ED Notes (Signed)
Pt ambulatory to restroom

## 2022-09-22 NOTE — ED Notes (Signed)
Patient offered a shower but declined. Will offer again during shift.

## 2022-09-22 NOTE — ED Notes (Signed)
Patient refused dinner tray; will order quesadilla per patient request.

## 2022-09-22 NOTE — ED Notes (Signed)
Patient refused morning medications stating "nope, I don't need them".

## 2022-09-23 NOTE — ED Notes (Signed)
Attempted to give patient her morning medications- patient refused stating she does not want them. This RN informed patient that if she changed her mind to let this RN know. Breakfast tray at bedside. This RN opened up food for patient and encouraged patient to eat. Patient eating at this time.

## 2022-09-23 NOTE — ED Notes (Signed)
Asked patient again if she would like anything to eat or drink and she states "no I don't"

## 2022-09-23 NOTE — TOC Progression Note (Addendum)
Transition of Care [facility]) - Progression Note    Patient Details  Name: Bonnie Schneider MRN: [id number] Date of Birth: January 21, 1956  Transition [facility] Care [facility]) CM/SW Contact  Darolyn Rua, [place] Phone Number: 09/23/2022, 9:50 AM  Clinical Narrative:     Update 11:38 am: Tresa Endo with [facility] returned call to report they are unable to accept patient at facility due to behaviors.     CSW notes [facility] in [place] made a bed offer, CSW followed up with Tresa Endo with [facility] admissions [phone]) . She reports the business office is reviewing and she will call me back to confirm bed offer.   Expected Discharge Plan: Skilled Nursing Facility Barriers to Discharge: Continued Medical Work up  Expected Discharge Plan and Services       Living arrangements for the past 2 months: Skilled Nursing Facility                                       Social Determinants of Health (SDOH) Interventions SDOH Screenings   Tobacco Use: High Risk (09/18/2022)    Readmission Risk Interventions     No data to display

## 2022-09-23 NOTE — ED Provider Notes (Signed)
-----------------------------------------   7:31 AM on 09/23/2022 -----------------------------------------   Blood pressure (!) 154/90, pulse 69, temperature (!) 97.5 F (36.4 C), temperature source Oral, resp. rate 18, height 1.549 m (5\' 1" ), weight 43.1 kg, SpO2 96 %.  The patient is calm and cooperative at this time.  There have been no acute events since the last update.  Awaiting disposition plan from [facility] team.   Loleta Rose, MD 09/23/22 [phone]

## 2022-09-23 NOTE — ED Notes (Signed)
Patient ambulating without assistance to the restroom. Patient offered food and drink. Patient denied anything at this time. No needs expressed to RN. NAD noted.

## 2022-09-24 NOTE — ED Provider Notes (Addendum)
-----------------------------------------   8:05 AM on 09/24/2022 -----------------------------------------   Blood pressure (!) 164/72, pulse 67, temperature 98.2 F (36.8 C), temperature source Oral, resp. rate 20, height 5\' 1"  (1.549 m), weight 43.1 kg, SpO2 95 %.  The patient is calm and cooperative at this time.  There have been no acute events since the last update.  Awaiting disposition plan from [facility] team.       Dionne Bucy, MD 09/24/22 [phone]

## 2022-09-24 NOTE — ED Notes (Signed)
Pt offered breakfast tray. Pt refused.

## 2022-09-24 NOTE — ED Notes (Signed)
Patient ambulatory to bathroom. Continent of urine. Ambulated around hallway with RN. Cola, applesauce and ice cream given per request. Patient denies additional needs.

## 2022-09-24 NOTE — TOC Progression Note (Signed)
Transition of Care [facility]) - Progression Note    Patient Details  Name: Bonnie Schneider MRN: [id number] Date of Birth: 15-May-1955  Transition of Care [facility]) CM/SW Contact  Darolyn Rua, [place] Phone Number: 09/24/2022, 3:22 PM  Clinical Narrative:     CSW updated DSS with [facility], patient's guardian, Rebecka Apley at [phone].   Informed patient continues to have no bed offers due to past behaviors.   CSW has reached out to Tammy with [facility] whose facilities are considering pending clinical review.    Expected Discharge Plan: Skilled Nursing Facility Barriers to Discharge: Continued Medical Work up  Expected Discharge Plan and Services       Living arrangements for the past 2 months: Skilled Nursing Facility                                       Social Determinants of Health (SDOH) Interventions SDOH Screenings   Tobacco Use: High Risk (09/18/2022)    Readmission Risk Interventions     No data to display

## 2022-09-24 NOTE — ED Notes (Signed)
Patient currently refusing to allow vital signs to be taken. Patient extremely withdrawn. Patient declined blanket. Declined going for a walk.

## 2022-09-24 NOTE — ED Notes (Signed)
RN attempted to obtain vital signs. Pt refused.

## 2022-09-24 NOTE — ED Notes (Signed)
Patient sitting upright in bed, resp even, unlabored on room air, currently withdrawn and tearful. Declined going for a walk with this RN. Chocolate ice cream given to patient and patient verbalized gratitude. No distress noted at this time.

## 2022-09-25 NOTE — ED Notes (Signed)
Pt resting quietly in her bed.

## 2022-09-25 NOTE — ED Notes (Signed)
Pt up to bathroom   pt alert  resting quietly.

## 2022-09-25 NOTE — ED Provider Notes (Signed)
-----------------------------------------   5:36 AM on 09/25/2022 -----------------------------------------   Blood pressure (!) 164/72, pulse 67, temperature 98.2 F (36.8 C), temperature source Oral, resp. rate 20, height 5\' 1"  (1.549 m), weight 43.1 kg, SpO2 95 %.  The patient is calm and cooperative at this time.  There have been no acute events since the last update.  Awaiting disposition plan from Social Work team.   Irean Hong, MD 09/25/22 [phone]

## 2022-09-25 NOTE — ED Notes (Signed)
Pt is calm laying in bed. Denies any needs. Has had poor appetite. Attempted to offer pt food/snack. Pt refused.

## 2022-09-25 NOTE — TOC Progression Note (Signed)
Transition of Care [facility]) - Progression Note    Patient Details  Name: Bonnie Schneider MRN: [id number] Date of Birth: 05/04/1955  Transition of Care [facility]) CM/SW Contact  Darolyn Rua, [place] Phone Number: 09/25/2022, 10:01 AM  Clinical Narrative:     CSW spoke with Tammy with [facility] who reports that all of their facilities have to declined patient due to behaviors, no current bed offers at this time.   Expected Discharge Plan: Skilled Nursing Facility Barriers to Discharge: Continued Medical Work up  Expected Discharge Plan and Services       Living arrangements for the past 2 months: Skilled Nursing Facility                                       Social Determinants of Health (SDOH) Interventions SDOH Screenings   Tobacco Use: High Risk (09/18/2022)    Readmission Risk Interventions     No data to display

## 2022-09-25 NOTE — ED Notes (Signed)
Coke provided per pt request 

## 2022-09-26 NOTE — ED Notes (Signed)
Patient is sitting on stretcher at this time. This nurse to introduce oneself. Patient denied any needs at this time. Patient told nurse to go away at this time.

## 2022-09-26 NOTE — ED Provider Notes (Signed)
BP (!) 147/75   Pulse 70   Temp 98.3 F (36.8 C) (Oral)   Resp 18   Ht 5\' 1"  (1.549 m)   Wt 43.1 kg   SpO2 96%   BMI 17.95 kg/m   The patient is calm and cooperative at this time. There have been no acute events since the last update. Awaiting disposition plan from Social Work team.    Willy Eddy, MD 09/26/22 [phone]

## 2022-09-26 NOTE — ED Notes (Signed)
Patient sitting on stretcher at this time. This nurse to assess any needs at this time. Patient told this nurse "to go on now."

## 2022-09-26 NOTE — ED Notes (Signed)
Pt refusing shower at this time. 

## 2022-09-26 NOTE — ED Notes (Addendum)
Patient lying on the bed with eyes closed. Patient easily arousal with sound. RN to ask patient about taking her night time medications. Patient told nurse "No, now shut up." Patient asked if this RN could take her Vital signs. Patient refused. Will continue to monitor.

## 2022-09-26 NOTE — ED Notes (Signed)
Patient lying in the bed with eyes closed. Chest rise and fall noted. Resp even and unlabored. Skin appears pink and dry. Nad noted at this time.

## 2022-09-27 NOTE — ED Notes (Signed)
Patient lying on stretcher at this time. Chest rise and fall noted. Resp even and nonlabored. Nad noted. Skin appears pink and dry.

## 2022-09-27 NOTE — ED Notes (Signed)
Patient lying on stretcher at this time. Resp even and unlabored. Chest rise and fall noted. Skin appears pink and dry. Nad noted at this time.

## 2022-09-27 NOTE — ED Notes (Signed)
Pt disposed of dinner plate and drink. Pt was agitated that a wheelchair had been left outside of the room across the hall from their bed. When pt asked if they needed anything pt stated "he needs to get his fucking ass on." Pt declined to elaborate further on who he is when questioned. The wheelchair was removed from the area near the pt's bed and pt was grateful. Pt was given clean blankets and the soiled blankets were removed from pt's bed. Pt remade her bed with the fresh blankets and is now resting in bed. Pt's bed is in the lowest, locked position and the call bell is in reach.

## 2022-09-27 NOTE — ED Notes (Signed)
Pt given food tray and pt ate a portion of their meal. Pt disposed of tray in hall trash can.

## 2022-09-27 NOTE — ED Notes (Signed)
Patient lying in bed with eyes closed. Chest rise and fall noted. Resp even and unlabored.Skin appears pink and dry.NAD noted at this time.

## 2022-09-27 NOTE — ED Notes (Signed)
Patient lying in bed at this time with eyes closed. Chest rise and fall noted. Resp even and unlabored. Skin appears pink and dry. NAD noted at this time.

## 2022-09-27 NOTE — ED Notes (Signed)
Patient alert and agitated. Patient asked if she needed anything or if she needed to use the restroom. Patient stated for this nurse to "go away."

## 2022-09-27 NOTE — ED Provider Notes (Signed)
-----------------------------------------   5:07 AM on 09/27/2022 -----------------------------------------   Blood pressure (!) 147/75, pulse 70, temperature 98.3 F (36.8 C), temperature source Oral, resp. rate 18, height 5\' 1"  (1.549 m), weight 43.1 kg, SpO2 96 %.  The patient is calm and cooperative at this time.  There have been no acute events since the last update.  Awaiting disposition plan from Social Work team.   Irean Hong, MD 09/27/22 [phone]

## 2022-09-27 NOTE — ED Notes (Signed)
Patient lying on stretcher with eyes closed. Chest rise and fall noted. Skin appears pink and dry. Resp even and nonlabored. Nad noted at this time.

## 2022-09-27 NOTE — TOC Progression Note (Signed)
Transition of Care [facility]) - Progression Note    Patient Details  Name: Bonnie Schneider MRN: [id number] Date of Birth: 11-13-1955  Transition of Care [facility]) CM/SW Contact  Margarito Liner, LCSW Phone Number: 09/27/2022, 2:33 PM  Clinical Narrative:   No bed offers. Sent clinicals to try and trigger responses.  Expected Discharge Plan: Skilled Nursing Facility Barriers to Discharge: Continued Medical Work up  Expected Discharge Plan and Services       Living arrangements for the past 2 months: Skilled Nursing Facility                                       Social Determinants of Health (SDOH) Interventions SDOH Screenings   Tobacco Use: High Risk (09/18/2022)    Readmission Risk Interventions     No data to display

## 2022-09-27 NOTE — ED Notes (Signed)
Pt allowed this RN to take their VS. Pt asked for spaghetti and meatballs for dinner. This RN called dietary and ordered food per pt request. Pt also asked for a diet and was given the drink. Pt is calm and cooperative at this time.

## 2022-09-27 NOTE — ED Notes (Signed)
Patient lying in bed with eyes closed. Chest rise and fall noted. Resp even and unlabored.Skin appears pink and dry.nad noted at this time. 

## 2022-09-27 NOTE — ED Notes (Signed)
Pt ambulated to hallway restroom. Pt has a steady, even gait, no assistance needed.

## 2022-09-27 NOTE — ED Notes (Signed)
Patient lying on stretcher with eyes closed at this time. Nad noted. Chest rise and fall noted. Skin appears pink and dry. Resp even and unlabored at this time.

## 2022-09-27 NOTE — ED Notes (Signed)
This RN went to introduce myself to the pt and see if she needs anything and pt responded "nope" and waved their hand in a motion for me to keep moving. A few moments later pt was trying to get the secretary's attention and this RN asked the pt what they needed and they responded "for you to keep your ass moving." The unit secretary spoke to the pt to see what they needed and pt responded "you know what I need." Pt then laid back down on her stretcher and appears to be resting. Pt's breathing is regular and unlabored, chest rise and fall is noted and pt appears to be in NAD.

## 2022-09-27 NOTE — ED Notes (Signed)
Pt received hospital tray from the female dietary personnel and pt refused tray. This RN stopped the personnel and took tray back to pt and pt stated they only wanted the plate of spaghetti, but nothing else. Pt had to be asked three times if they would like a utensil to eat their food with before they decided they wanted a fork. Pt was given a fresh coke with ice.

## 2022-09-27 NOTE — ED Notes (Signed)
Pt ambulated to hall bathroom without assistance of RN, BUT pt wanted RN to walk with her because of the EMS stretcher that they had to walk by. Pt exhibits some anxiety and agitation having to walk by the stretchers and wheelchairs that have been in the hallway during today's shift.

## 2022-09-27 NOTE — ED Notes (Signed)
Pt is resting comfortably in bed. Chest rise and fall noted, skin is pink and dry. NAD noted.

## 2022-09-27 NOTE — ED Notes (Signed)
Pt refused morning medications and having their vitals taken, otherwise pt is calm and resting.

## 2022-09-28 NOTE — ED Notes (Signed)
Pt states that she is "not taking any of her medications because they are stupid and she does not need them, if you don't get away I'm going to beat you ass." Offered pt food and drink, pt refuses.

## 2022-09-28 NOTE — TOC Progression Note (Signed)
Transition of Care [facility]) - Progression Note    Patient Details  Name: Bonnie Schneider MRN: [id number] Date of Birth: [birth year]  Transition of Care [facility]) CM/SW Contact  Liliana Cline, LCSW Phone Number: 09/28/2022, 9:11 AM  Clinical Narrative:    Awaiting bed offers.    Expected Discharge Plan: Skilled Nursing Facility Barriers to Discharge: Continued Medical Work up  Expected Discharge Plan and Services       Living arrangements for the past 2 months: Skilled Nursing Facility                                       Social Determinants of Health (SDOH) Interventions SDOH Screenings   Tobacco Use: High Risk (09/18/2022)    Readmission Risk Interventions     No data to display

## 2022-09-28 NOTE — ED Notes (Signed)
Pt ambulatory in hallway to bathroom, this RN attempted to talk to pt, pt refuses to talk, pt refuses vitals, states "go on somewhere" pt refuses food or drink as well

## 2022-09-28 NOTE — ED Notes (Signed)
Pt visitors at bedside, Jasmine December pt's sister.

## 2022-09-28 NOTE — ED Notes (Signed)
Pt refusing to allow this RN to obtain vitals and temp.

## 2022-09-28 NOTE — ED Provider Notes (Signed)
Vitals:   09/27/22 1515 09/28/22 0657  BP: (!) 151/59 (!) 159/83  Pulse: (!) 59 60  Resp: 16 16  Temp: 97.6 F (36.4 C)   SpO2: 96% 99%   Patient currently sitting up alert.  Tells me that today she just does not feel like talking with anyone.  She is not any distress, but reports she just does not want to talk right now.  There have been no acute events since the last update.  Awaiting disposition plan from Social Work team who currently report awaiting bed offers.     Sharyn Creamer, MD 09/28/22 1202

## 2022-09-28 NOTE — ED Notes (Signed)
Pt finished breakfast eating 100% of plate.

## 2022-09-28 NOTE — ED Notes (Addendum)
Pt visitors left, Jasmine December sister. Given update on pt

## 2022-09-29 NOTE — ED Notes (Addendum)
Pt refused vital signs. Pt offered breakfast tray and water; she declined. Pt also refused to take meds.

## 2022-09-29 NOTE — ED Notes (Signed)
Pt given lunch tray; ate approximately 25%.

## 2022-09-29 NOTE — ED Provider Notes (Signed)
-----------------------------------------   6:20 AM on 09/29/2022 -----------------------------------------   Blood pressure (!) 159/83, pulse 60, temperature 97.6 F (36.4 C), temperature source Oral, resp. rate 16, height 5\' 1"  (1.549 m), weight 43.1 kg, SpO2 99 %.  The patient is calm and cooperative at this time.  There have been no acute events since the last update.  Awaiting disposition plan from case management/social work.    Corena Herter, MD 09/29/22 [phone]

## 2022-09-29 NOTE — ED Notes (Signed)
Pt refuses vitals  

## 2022-09-30 NOTE — ED Notes (Signed)
Pt asking for something to eat. Pt given sandwich tray and pepsi. Eating in bed at this time

## 2022-09-30 NOTE — ED Notes (Signed)
Pt refused morning meds again.

## 2022-09-30 NOTE — ED Notes (Signed)
Pt sleeping in hallway bed.

## 2022-09-30 NOTE — ED Notes (Signed)
Pt alert and resting in bed at this time

## 2022-09-30 NOTE — ED Notes (Signed)
Pt refused meal tray. This RN placed the meal tray beside patients bed on table and informed patient that it would be beside her if she changed her mind. After this RN walked away patient got out of bed and put the entire tray of food in the trash can.

## 2022-09-30 NOTE — ED Notes (Signed)
Family refusing to take medications. This RN offered to get pepsi for patient to drink with medications. Pt responded "nooooope". RN requested multiple times for pt to take her meds and each time patient responded "nooope"

## 2022-09-30 NOTE — ED Notes (Signed)
Pt ambulated to the bathroom at this time 

## 2022-09-30 NOTE — ED Notes (Signed)
Pt declining meal tray at this time

## 2022-09-30 NOTE — ED Notes (Signed)
Pt given pepsi at this time

## 2022-10-01 NOTE — ED Notes (Signed)
Patient refused all meds including them crushed in applesauce repeating she doesn't need it and that nothing is wrong with her. Meds wasted.

## 2022-10-01 NOTE — ED Notes (Signed)
Patient provided meal tray and patient did not want any contents. Patient provided cola and chocolate ice cream.

## 2022-10-01 NOTE — ED Provider Notes (Signed)
Emergency Medicine Observation Re-evaluation Note  Bonnie Schneider is a 67 y.o. female, seen on rounds today.  Pt initially presented to the ED for complaints of Psychiatric Evaluation Currently, the patient is awaiting dispo.  Physical Exam  BP (!) 153/82 (BP Location: Left Arm)   Pulse 60   Temp 98.2 F (36.8 C) (Oral)   Resp 20   Ht 5\' 1"  (1.549 m)   Wt 43.1 kg   SpO2 98%   BMI 17.95 kg/m  Physical Exam General: resting comfortably   ED Course / MDM  No labs in past 24 hours  Plan  Current plan is for dispo.    Phineas Semen, MD 10/01/22 [phone]

## 2022-10-01 NOTE — ED Notes (Signed)
Patient given a lunch tray.  

## 2022-10-01 NOTE — ED Notes (Addendum)
Patient refusing to eat any meal placed in front of her and she throws it in the trash as soon as staff walks away. She refuses to drink fluids and when snacks are left for her she throws it in the trash. Patient remains confused to place, situation, time. She is repetitive, cursing, grabbing staff when attempts to reason with her on medical reasons to take her meds and nutritional coaching. MD Rey notified.

## 2022-10-01 NOTE — ED Notes (Signed)
Pt ambulatory to bathroom

## 2022-10-01 NOTE — ED Notes (Signed)
Pt provided with drink per request

## 2022-10-01 NOTE — ED Notes (Signed)
Patient ambulatory to bathroom.

## 2022-10-01 NOTE — ED Notes (Signed)
Pt given a breakfast tray.

## 2022-10-01 NOTE — ED Notes (Signed)
Pt ambulated to bathroom independently at this time.   

## 2022-10-02 DIAGNOSIS — R462 Strange and inexplicable behavior: Secondary | ICD-10-CM | POA: Diagnosis not present

## 2022-10-02 LAB — CBC WITH DIFFERENTIAL/PLATELET
Abs Immature Granulocytes: 0.02 10*3/uL (ref 0.00–0.07)
Basophils Absolute: 0 10*3/uL (ref 0.0–0.1)
Basophils Relative: 0 %
Eosinophils Absolute: 0.1 10*3/uL (ref 0.0–0.5)
Eosinophils Relative: 1 %
HCT: 41.9 % (ref 36.0–46.0)
Hemoglobin: 14.1 g/dL (ref 12.0–15.0)
Immature Granulocytes: 0 %
Lymphocytes Relative: 27 %
Lymphs Abs: 2.1 10*3/uL (ref 0.7–4.0)
MCH: 28.8 pg (ref 26.0–34.0)
MCHC: 33.7 g/dL (ref 30.0–36.0)
MCV: 85.7 fL (ref 80.0–100.0)
Monocytes Absolute: 0.5 10*3/uL (ref 0.1–1.0)
Monocytes Relative: 7 %
Neutro Abs: 4.9 10*3/uL (ref 1.7–7.7)
Neutrophils Relative %: 65 %
Platelets: 358 10*3/uL (ref 150–400)
RBC: 4.89 MIL/uL (ref 3.87–5.11)
RDW: 13.9 % (ref 11.5–15.5)
WBC: 7.6 10*3/uL (ref 4.0–10.5)
nRBC: 0 % (ref 0.0–0.2)

## 2022-10-02 LAB — BASIC METABOLIC PANEL
Anion gap: 12 (ref 5–15)
BUN: 16 mg/dL (ref 8–23)
CO2: 23 mmol/L (ref 22–32)
Calcium: 9 mg/dL (ref 8.9–10.3)
Chloride: 106 mmol/L (ref 98–111)
Creatinine, Ser: 0.58 mg/dL (ref 0.44–1.00)
GFR, Estimated: >60 mL/min (ref >60)
Glucose, Bld: 204 mg/dL — ABNORMAL HIGH (ref 70–99)
Potassium: 3.1 mmol/L — ABNORMAL LOW (ref 3.5–5.1)
Sodium: 141 mmol/L (ref 135–145)

## 2022-10-02 MED ORDER — DROPERIDOL 2.5 MG/ML IJ SOLN
2.5000 mg | Freq: Once | INTRAMUSCULAR | Status: AC
  Administered 2022-10-02: 2.5 mg via INTRAMUSCULAR
  Filled 2022-10-02: qty 2

## 2022-10-02 NOTE — ED Notes (Signed)
Cheese pizza ordered from dietary per pt request and cola given to pt as well per request.

## 2022-10-02 NOTE — ED Provider Notes (Incomplete)
Emergency Medicine Observation Re-evaluation Note  Bonnie Schneider is a 67 y.o. female, seen on rounds today.  Pt initially presented to the ED for complaints of Psychiatric Evaluation  Currently, the patient is resting in bed. Patient has been refusing meds and regular meals today,   Physical Exam  BP (!) 197/86 (BP Location: Left Arm)   Pulse 61   Temp 97.7 F (36.5 C) (Oral)   Resp 14   Ht 5\' 1"  (1.549 m)   Wt 43.1 kg   SpO2 94%   BMI 17.95 kg/m  Physical Exam General: Resting comfortably in bed  ED Course / MDM  EKG:   I have reviewed the labs and medications over the past 24 hours. Recent changes in the last 24 hours are none.    Plan  Current plan is for dispo per social work.

## 2022-10-02 NOTE — ED Notes (Signed)
Pt still refusing daily medications, pt made aware dinner tray was at bedside, pt turned over and ignored this RN. NAD noted.

## 2022-10-02 NOTE — ED Notes (Signed)
Pt did allow this RN to take vitals, pt refuses daily medications, MD aware.

## 2022-10-02 NOTE — ED Notes (Signed)
Pt ambulated to bathroom independently. Pt declined dinner tray

## 2022-10-02 NOTE — TOC Progression Note (Signed)
Transition of Care [facility]) - Progression Note    Patient Details  Name: Bonnie Schneider MRN: [id number] Date of Birth: 1955/05/28  Transition of Care [facility]) CM/SW Contact  Darolyn Rua, [place] Phone Number: 10/02/2022, 9:24 AM  Clinical Narrative:     CSW updated DSS with [facility], patient's guardian, Rebecka Apley at [phone].    Informed patient continues to have no bed offers due to past behaviors  Judeth Cornfield suggested reaching out to [place] for assistance in placement.   CSW called Alvy Beal with [facility] to make referral - no answer lvm  [facility]  [address], [address] [place] [address] Office [phone], [phone] Fax [phone] E [email or web address]  Expected Discharge Plan: Skilled Nursing Facility Barriers to Discharge: Continued Medical Work up  Expected Discharge Plan and Services       Living arrangements for the past 2 months: Skilled Nursing Facility                                       Social Determinants of Health (SDOH) Interventions SDOH Screenings   Tobacco Use: High Risk (09/18/2022)    Readmission Risk Interventions     No data to display

## 2022-10-02 NOTE — ED Notes (Signed)
D/w Dr. York Cerise, reports from previous shift RNs that the patient is not eating and drinking and refusing all meds for several days.

## 2022-10-02 NOTE — ED Notes (Signed)
Patient ambulatory to restroom  ?

## 2022-10-02 NOTE — ED Notes (Signed)
Pt refused dinner tray

## 2022-10-02 NOTE — ED Notes (Signed)
Pt ate most of her morning meal.  Extensive conversations were discussed with pt about a blood draw due to pt not drinking and/or eating and that she would need to be medicated if she was not going to allow this RN or other staff to draw blood, pt still refuses, MD at bedside also discussed with pt her options and pt still refuses blood raw.

## 2022-10-02 NOTE — ED Notes (Signed)
Dietary contacted due to cheese pizza arriving, dietary states "it just left the kitchen 15 minutes ago".

## 2022-10-02 NOTE — ED Notes (Signed)
Provided snack box and water 

## 2022-10-02 NOTE — ED Provider Notes (Signed)
Emergency Medicine Observation Re-evaluation Note  Bonnie Schneider is a 67 y.o. female, seen on rounds today.  Pt initially presented to the ED for complaints of Psychiatric Evaluation  Currently, the patient is is no acute distress. Denies any concerns at this time.  Physical Exam  Blood pressure (!) 163/79, pulse 73, temperature 97.8 F (36.6 C), temperature source Oral, resp. rate 16, height 5\' 1"  (1.549 m), weight 43.1 kg, SpO2 95 %.  Physical Exam: General: No apparent distress Pulm: Normal WOB Neuro: Moving all extremities Psych: Resting comfortably     ED Course / MDM     I have reviewed the labs performed to date as well as medications administered while in observation.  Recent changes in the last 24 hours include: No acute events overnight.  Reports of patient refusing food, patient unable to state why we want repeat labs today.  Given IM droperidol and lab work obtained.  Unable to verbally unable to verbally discussed with the patient repeat lab work for mild hypokalemia.  Discussed this with the patient.  Plan for ongoing placement request.  Plan   Current plan: Patient awaiting social work disposition Patient is not under full IVC at this time.    Corena Herter, MD 10/02/22 1139

## 2022-10-02 NOTE — ED Notes (Signed)
This RN went to talk with pt about getting some blood work. Pt states "No" RN asked if the pt knows where she is. Pt states "Anywhere" RN explained that she was in the hospital and the doctor wanted to get some blood work to see how to help her. Pt states "No. You can leave now." Primary RN notified.

## 2022-10-02 NOTE — ED Notes (Signed)
Pt given menu and encouraged to look at the options in hopes that pt will find something she would like to order and eat, pt told to ask this RN if she needed any help with the menu.   Pt requests to have her blood drawn "when I'm fully wake up". NAD noted.

## 2022-10-02 NOTE — ED Notes (Signed)
Pt refused blood draw at this time and keeps cursing at this RN when she walks away, pt educated she would be getting IM meds to help obtain the blood work ordered by MD.

## 2022-10-02 NOTE — ED Notes (Signed)
Update given to sister Jasmine December

## 2022-10-02 NOTE — ED Provider Notes (Signed)
Emergency Medicine Observation Re-evaluation Note  Bonnie Schneider is a 67 y.o. female, seen on rounds today.  Pt initially presented to the ED for complaints of Psychiatric Evaluation  Currently, the patient is resting in bed. She is awaiting disposition.  Physical Exam  BP (!) 197/86 (BP Location: Left Arm)   Pulse 61   Temp 97.7 F (36.5 C) (Oral)   Resp 14   Ht 5\' 1"  (1.549 m)   Wt 43.1 kg   SpO2 94%   BMI 17.95 kg/m  Physical Exam General: Resting comfortably in bed  ED Course / MDM  EKG:   No labs or medication changes in last 24 hours.  Plan  Current plan is for dispo per social work.     Trinna Post, MD 10/02/22 0005

## 2022-10-02 NOTE — TOC Progression Note (Signed)
Transition of Care [facility]) - Progression Note    Patient Details  Name: Bonnie Schneider MRN: [id number] Date of Birth: [birth year]  Transition of Care [facility]) CM/SW Contact  Darleene Cleaver, [place] Phone Number: 10/02/2022, 2:42 PM  Clinical Narrative:     CSW spoke to [place] from [place], and informed her that patient's legal guardian would like a referral to made to them.  CSW provided Alvy Beal the contact information for the legal guardian and name of patient.  Alvy Beal said she will make a referral to [facility] care management.  TOC to continue to follow patient's progress throughout discharge planning.   Expected Discharge Plan: Skilled Nursing Facility Barriers to Discharge: Continued Medical Work up  Expected Discharge Plan and Services       Living arrangements for the past 2 months: Skilled Nursing Facility                                       Social Determinants of Health (SDOH) Interventions SDOH Screenings   Tobacco Use: High Risk (09/18/2022)    Readmission Risk Interventions     No data to display

## 2022-10-03 NOTE — ED Notes (Signed)
Pt ambulated independently to bathroom and back, states no needs at this time, NAD

## 2022-10-03 NOTE — ED Notes (Signed)
Pt ambulatory to restroom

## 2022-10-03 NOTE — ED Notes (Signed)
Pt given coke as requested

## 2022-10-03 NOTE — ED Notes (Signed)
Pt eating dinner

## 2022-10-03 NOTE — ED Notes (Signed)
Dietary contacted and requested cheese pizza for pt d/t pt not wanting to eat today

## 2022-10-03 NOTE — ED Provider Notes (Signed)
Emergency Medicine Observation Re-evaluation Note  Bonnie Schneider is a 67 y.o. female, seen on rounds today.  Pt initially presented to the ED for complaints of Psychiatric Evaluation  Currently, the patient is resting comfortably.  Physical Exam  BP (!) 162/83 (BP Location: Left Arm)   Pulse 61   Temp 97.9 F (36.6 C) (Oral)   Resp 18   Ht 5\' 1"  (1.549 m)   Wt 43.1 kg   SpO2 93%   BMI 17.95 kg/m  General: No acute distress Cardiac: Well-perfused extremities Lungs: No respiratory distress Psych: Appropriate mood and affect  ED Course / MDM  EKG:   I have reviewed the labs performed to date as well as medications administered while in observation.  Recent changes in the last 24 hours include none.  Plan  Current plan is for placement.   Merwyn Katos, MD 10/03/22 2222

## 2022-10-03 NOTE — ED Notes (Signed)
This tech obtained vital signs on pt.  Pt has no other needs at the moment.

## 2022-10-03 NOTE — ED Notes (Signed)
Pt given coke as requested. Declining to take meds.

## 2022-10-04 NOTE — ED Notes (Signed)
Pt ambulated independently to shower, which she performed independently, and back. New linens were placed, pt was given all new clothing as well as toothbrush, toothpaste, deodorant and comb.

## 2022-10-04 NOTE — ED Provider Notes (Signed)
-----------------------------------------   6:21 AM on 10/04/2022 -----------------------------------------   Blood pressure (!) 162/83, pulse 61, temperature 97.9 F (36.6 C), temperature source Oral, resp. rate 18, height 5\' 1"  (1.549 m), weight 43.1 kg, SpO2 93%.  The patient is calm and cooperative at this time.  There have been no acute events since the last update.  Awaiting disposition plan from Social Work team.   Irean Hong, MD 10/04/22 [phone]

## 2022-10-05 NOTE — ED Notes (Addendum)
Pt eating breakfast 

## 2022-10-05 NOTE — ED Notes (Signed)
Pt. Given chocolate ice cream

## 2022-10-05 NOTE — ED Notes (Signed)
Pt ambulatory to restroom

## 2022-10-05 NOTE — ED Notes (Signed)
Pt given coke as requested 

## 2022-10-05 NOTE — ED Notes (Signed)
Pt declining medications, when asked why, pt states "I just dont want it". Denies needs

## 2022-10-05 NOTE — ED Notes (Signed)
Pt given coke as requested. Denies other needs.

## 2022-10-05 NOTE — ED Notes (Signed)
Pt eating hotdog for lunch

## 2022-10-05 NOTE — ED Notes (Signed)
Pt given dinner tray.

## 2022-10-05 NOTE — ED Notes (Signed)
Pt refusing to eat dinner tray. Given chocolate ice cream

## 2022-10-05 NOTE — TOC CM/SW Note (Signed)
CSW left a vm for Lakisha to check status of [facility] referral for patient. Tel #[phone].  Layla Barter, LCSWA TOC-Weekends [phone]

## 2022-10-06 NOTE — ED Notes (Signed)
RN called again for meal tray. Nutrition stated they will send a meal tray up

## 2022-10-06 NOTE — ED Notes (Signed)
Dr. Roxan Hockey notified of BP 200/83, but that pt is refusing medications. RN asked if pt would take any medication for her BP and pt stated no. Pt provided with apple sauce.

## 2022-10-06 NOTE — ED Provider Notes (Signed)
-----------------------------------------   6:24 AM on 10/06/2022 -----------------------------------------   Blood pressure (!) 144/71, pulse 67, temperature 98.3 F (36.8 C), temperature source Oral, resp. rate 17, height 5\' 1"  (1.549 m), weight 43.1 kg, SpO2 95%.  The patient is calm and cooperative at this time.  There have been no acute events since the last update.  Awaiting disposition plan from Social Work team.   Irean Hong, MD 10/06/22 [phone]

## 2022-10-06 NOTE — ED Notes (Signed)
Pt is eating bites out of her apple sauces. Breakfast has not been delivered. Food and nutrition state they are funning behind.

## 2022-10-06 NOTE — ED Provider Notes (Signed)
Emergency Medicine Observation Re-evaluation Note  Bonnie Schneider is a 67 y.o. female, currently boarding in the emergency department  No acute events since last update  Physical Exam  BP (!) 200/83   Pulse 75   Temp 97.7 F (36.5 C) (Oral)   Resp 14   Ht 5\' 1"  (1.549 m)   Wt 43.1 kg   SpO2 98%   BMI 17.95 kg/m    ED Course / MDM   Lab work on 7/10 shows a reassuring chemistry reassuring CBC no significant signs of urinary tract infection given the squamous cells present.  Chemistry reassuring.  Plan  Current plan is for placement to an appropriate living facility once available.  Social worker is currently working with the patient to achieve this.    [name], MD 10/06/22 2328

## 2022-10-06 NOTE — ED Notes (Signed)
Patient ate first apple sauce, second apple sauce provided.

## 2022-10-07 NOTE — ED Notes (Signed)
Pt given chocolate ice cream and coke as requested by pt.

## 2022-10-08 NOTE — ED Provider Notes (Signed)
Emergency Medicine Observation Re-evaluation Note  Bonnie Schneider is a 67 y.o. female, seen on rounds today.  Pt initially presented to the ED for complaints of Psychiatric Evaluation Currently, the patient is calm in NAD. No acute events overnight.  Physical Exam  BP (!) 147/79 (BP Location: Left Arm)   Pulse 76   Temp 98 F (36.7 C) (Oral)   Resp 20   Ht 5\' 1"  (1.549 m)   Wt 43.1 kg   SpO2 96%   BMI 17.95 kg/m   ED Course / MDM  EKG:   I have reviewed the labs performed to date as well as medications administered while in observation.  Recent changes in the last 24 hours include None.  Plan  Current plan is for TOC disposition.    Shaune Pollack, MD 10/08/22 [phone]

## 2022-10-08 NOTE — ED Notes (Addendum)
Pt agrees to take a shower, pt given supplies and taken to shower, pt states that she didn't want breakfast, states that she only had the juice, offered to order something else and also order something for lunch, pt states that not at this time.  Pt denies pain, pt calm and cooperative.  Pt states that she doesn't want any of her 10 am medications.

## 2022-10-08 NOTE — ED Notes (Signed)
Pt showered and changed bedding.

## 2022-10-08 NOTE — TOC Progression Note (Addendum)
Transition of Care [facility]) - Progression Note    Patient Details  Name: Bonnie Schneider MRN: [id number] Date of Birth: 1955-03-28  Transition of Care [facility]) CM/SW Contact  Darleene Cleaver, [place] Phone Number: 10/08/2022, 9:48 AM  Clinical Narrative:     CSW has resent patient's clinicals to 33 nursing facilities waiting on bed offers.  CSW also attempted to contact patient's legal guardian Rebecka Apley, from [place] [employer], [phone], left a message on voice mail requesting an update.  CSW also tried to call [facility] worker to see if they have made a decision on patient.  They were considering.   Expected Discharge Plan: Skilled Nursing Facility Barriers to Discharge: Continued Medical Work up  Expected Discharge Plan and Services       Living arrangements for the past 2 months: Skilled Nursing Facility                                       Social Determinants of Health (SDOH) Interventions SDOH Screenings   Food Insecurity: No Food Insecurity (11/15/2021)   Received from [facility]  Transportation Needs: No Transportation Needs (02/23/2021)   Received from [facility]  Social Connections: Unknown (08/06/2021)   Received from [facility]  Tobacco Use: High Risk (09/18/2022)    Readmission Risk Interventions     No data to display

## 2022-10-08 NOTE — ED Notes (Signed)
Pt ate some of her meal, pt had a fish sandwich.  Coke given, pt requests ice cream, pt given chocolate ice cream.

## 2022-10-08 NOTE — ED Notes (Signed)
Pt given chocolate ice cream

## 2022-10-08 NOTE — ED Notes (Signed)
Pt awake, ordered pt a meal

## 2022-10-08 NOTE — ED Notes (Signed)
Pt ambulatory around department with minimal assistance.

## 2022-10-09 NOTE — ED Notes (Signed)
Pt refused vital signs.

## 2022-10-09 NOTE — ED Notes (Signed)
Pt refusing vital signs, will try again later.

## 2022-10-09 NOTE — ED Notes (Signed)
Pt asking for a cup of coffee, provided by this RN. Pt took one swallow and did not like it. Offered Coke instead and pt agreed. Placed on bedside table.

## 2022-10-09 NOTE — ED Notes (Signed)
 Pt resting in bed, denies complaints.  

## 2022-10-09 NOTE — ED Notes (Signed)
Pt unwilling to answer this RN when asking if pt is feeling ok, or if she needs anything. Will continue to monitor.

## 2022-10-09 NOTE — NC FL2 (Addendum)
[place] MEDICAID FL2 LEVEL OF CARE FORM     IDENTIFICATION  Patient Name: Bonnie Schneider Birthdate: January 30, 1956 Sex: female Admission Date (Current Location): 09/17/2022  [place] and [place] Number:  [address] [id number] [facility] Facility and Address:  [facility], [address] [place], [place] [address]      Provider Number: [id number]  Attending Physician Name and Address:  Donna Bernard, MD  Relative Name and Phone Number:  Judeth Cornfield  [phone]    Current Level of Care: Hospital Recommended Level of Care: Assisted Living Facility, Memory Care Prior Approval Number:    Date Approved/Denied:   PASRR Number: [id number]  Discharge Plan: Other (Comment) (Memory Care ALF)    Current Diagnoses: Patient Active Problem List   Diagnosis Date Noted    [place] 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  Bizarre behavior 02/06/2022 09/17/2022   Hypocortisolemia (HCC) 02/19/2016   Hypertension 10/21/2011   Chronic back pain 10/21/2011   Encounter for screening colonoscopy 10/21/2011   Bloating 10/21/2011    Orientation RESPIRATION BLADDER Height & Weight     Self, Place  Normal Incontinent Weight: 95 lb (43.1 kg) Height:  5\' 1"  (154.9 cm)  BEHAVIORAL SYMPTOMS/MOOD NEUROLOGICAL BOWEL NUTRITION STATUS      Continent Diet (see discharge summary)  AMBULATORY STATUS COMMUNICATION OF NEEDS Skin   Independent Verbally Normal                       Personal Care Assistance Level of Assistance  Bathing, Dressing, Feeding, Total care Bathing Assistance: Limited assistance Feeding assistance: Independent Dressing Assistance: Limited assistance Total Care Assistance: Limited assistance   Functional Limitations Info  Sight, Hearing, Speech Sight Info: Adequate Hearing Info: Adequate Speech Info: Adequate    SPECIAL CARE FACTORS FREQUENCY                       Contractures Contractures Info: Not present    Additional Factors Info  Code Status,  Allergies Code Status Info: full Allergies Info: NKA           Current Medications (10/09/2022):  This is the current hospital active medication list Current Facility-Administered Medications  Medication Dose Route Frequency Provider Last Rate Last Admin   amitriptyline (ELAVIL) tablet 150 mg  150 mg Oral QHS Delton Prairie, MD       amLODipine (NORVASC) tablet 10 mg  10 mg Oral Daily Sharman Cheek, MD   10 mg at 09/21/22 7322   aspirin chewable tablet 81 mg  81 mg Oral Daily Delton Prairie, MD   81 mg at 09/21/22 0254   clopidogrel (PLAVIX) tablet 75 mg  75 mg Oral Daily Delton Prairie, MD   75 mg at 09/21/22 0905   rosuvastatin (CRESTOR) tablet 40 mg  40 mg Oral QHS Delton Prairie, MD       senna-docusate (Senokot-S) tablet 2 tablet  2 tablet Oral BID Delton Prairie, MD   2 tablet at 09/21/22 0906   Current Outpatient Medications  Medication Sig Dispense Refill   aspirin 81 MG chewable tablet Chew 81 mg by mouth daily.     clopidogrel (PLAVIX) 75 MG tablet Take 75 mg by mouth daily.     mirtazapine (REMERON) 7.5 MG tablet Take 7.5 mg by mouth at bedtime.     rosuvastatin (CRESTOR) 40 MG tablet Take 40 mg by mouth at bedtime.     senna-docusate (SENOKOT-S) 8.6-50 MG tablet Take 2 tablets by mouth 2 (two) times daily.  alprazolam (XANAX) 2 MG tablet Take 2 mg by mouth 4 (four) times daily. Anxiety (Patient not taking: Reported on 09/17/2022)     amitriptyline (ELAVIL) 50 MG tablet Take 150 mg by mouth at bedtime.  (Patient not taking: Reported on 09/17/2022)     enalapril (VASOTEC) 20 MG tablet TAKE 1 TABLET BY MOUTH EVERY DAY (Patient not taking: Reported on 09/17/2022) 30 tablet 0   furosemide (LASIX) 20 MG tablet Take 20 mg by mouth daily as needed for fluid.  (Patient not taking: Reported on 09/17/2022)     hydrochlorothiazide (HYDRODIURIL) 25 MG tablet Take 25 mg by mouth daily. (Patient not taking: Reported on 09/17/2022)     HYDROcodone-acetaminophen (NORCO) 10-325 MG per tablet Take 1  tablet by mouth every 6 (six) hours as needed for pain. (Patient not taking: Reported on 09/17/2022)     Omega-3 Fatty Acids (FISH OIL) 1000 MG CAPS Take 3,000 mg by mouth daily. (Patient not taking: Reported on 09/17/2022)     pantoprazole (PROTONIX) 40 MG tablet Take 40 mg by mouth daily. (Patient not taking: Reported on 09/17/2022)     rizatriptan (MAXALT) 10 MG tablet Take 10 mg by mouth as needed. May repeat in 2 hours if needed (Patient not taking: Reported on 09/17/2022)     traZODone (DESYREL) 100 MG tablet Take 100 mg by mouth at bedtime. (Patient not taking: Reported on 09/17/2022)       Discharge Medications: Please see discharge summary for a list of discharge medications.  Relevant Imaging Results:  Relevant Lab Results:   Additional Information SSN: [id number]  Darleene Cleaver, LCSW

## 2022-10-09 NOTE — ED Notes (Signed)
Pt refusing vital signs at this time. Pt still not speaking to this RN when asked questions. Pt currently laying in bed w/ blankets covering her head.

## 2022-10-09 NOTE — ED Notes (Signed)
 Pt given ice cream

## 2022-10-09 NOTE — ED Notes (Signed)
Pt refusing lunch.

## 2022-10-09 NOTE — ED Notes (Signed)
Pt asleep in bed, resting comfortably

## 2022-10-09 NOTE — TOC Progression Note (Addendum)
Transition of Care [facility]) - Progression Note    Patient Details  Name: BENETTA MACLAREN MRN: [id number] Date of Birth: 1956/01/07  [facility]) CM/SW Contact  Darleene Cleaver, [place] Phone Number: 10/09/2022, 6:23 PM  Clinical Narrative:     CSW spoke to Rebecka Apley patient's legal guardian through [facility].  She requested and updated FL2 with memory care placement on the [facility] so she can try to work on memory care facilities that can accept patient.  CSW updated FL2 and emailed her back awaiting for bed offers.  CSW spoke to [facility] and patient has been assigned a complex care coordinator by the name of Despina Pole, [phone].  She will have to complete an assessment on patient to assist with placement as well as legal guardian.   Expected Discharge Plan: Skilled Nursing Facility Barriers to Discharge: Continued Medical Work up  Expected Discharge Plan and Services       Living arrangements for the past 2 months: Skilled Nursing Facility                                       Social Determinants of Health (SDOH) Interventions SDOH Screenings   Food Insecurity: No Food Insecurity (11/15/2021)   Received from [facility]  Transportation Needs: No Transportation Needs (02/23/2021)   Received from [facility]  Social Connections: Unknown (08/06/2021)   Received from [facility]  Tobacco Use: High Risk (09/18/2022)    Readmission Risk Interventions     No data to display

## 2022-10-10 NOTE — ED Notes (Signed)
Pt refusing all medications. This RN tried to convince her to take it. Pt continuing to refuse. Pt asking for something "good to eat". Pt given ice cream. Pt denies further needs.

## 2022-10-10 NOTE — ED Provider Notes (Signed)
Emergency Medicine Observation Re-evaluation Note  Bonnie Schneider is a 67 y.o. female, seen on rounds today.  Pt initially presented to the ED for complaints of Psychiatric Evaluation  Currently, the patient is resting in bed. No acute events reported by nursing team.  Physical Exam  BP (!) 179/76 (BP Location: Left Arm)   Pulse (!) 56   Temp 97.6 F (36.4 C) (Oral)   Resp 17   Ht 5\' 1"  (1.549 m)   Wt 43.1 kg   SpO2 94%   BMI 17.95 kg/m  Physical Exam General: Resting comfortably in bed  ED Course / MDM  EKG:   No labs last 24 hours.  Plan  Current plan is for dispo per social work.    Trinna Post, MD 10/10/22 Marlyne Beards

## 2022-10-10 NOTE — ED Notes (Signed)
Pt resting in bed at this time. 

## 2022-10-10 NOTE — ED Notes (Signed)
Pt ambulatory to restroom and returned to bed at this time.

## 2022-10-11 NOTE — TOC Progression Note (Signed)
Transition of Care [facility]) - Progression Note    Patient Details  Name: Bonnie Schneider MRN: [id number] Date of Birth: 02-03-1956  Transition of Care [facility]) CM/SW Contact  Darleene Cleaver, [place] Phone Number: 10/11/2022, 7:00 PM  Clinical Narrative:     Per legal guardian they are looking for memory care facility for patient.  Expected Discharge Plan: Skilled Nursing Facility Barriers to Discharge: Continued Medical Work up  Expected Discharge Plan and Services       Living arrangements for the past 2 months: Skilled Nursing Facility                                       Social Determinants of Health (SDOH) Interventions SDOH Screenings   Food Insecurity: No Food Insecurity (11/15/2021)   Received from [facility], [facility]  Transportation Needs: No Transportation Needs (02/23/2021)   Received from [facility], [facility]  Social Connections: Unknown (08/06/2021)   Received from [facility], [facility]  Tobacco Use: High Risk (09/18/2022)    Readmission Risk Interventions     No data to display

## 2022-10-11 NOTE — ED Notes (Signed)
Patient calm and cooperative at this time.  Refusing medications.  Offered comfort measures and patient refused.  Patient pleasant and will interact with staff at this time.

## 2022-10-11 NOTE — ED Notes (Signed)
Nurse allowed this pt a snack and beverage.

## 2022-10-11 NOTE — ED Notes (Signed)
Pt received dinner tray. No complaints at this time.

## 2022-10-12 NOTE — ED Provider Notes (Signed)
Emergency Medicine Observation Re-evaluation Note  Bonnie Schneider is a 67 y.o. female, seen on rounds today.  Pt initially presented to the ED for complaints of Psychiatric Evaluation Currently, the patient awaiting placement  Physical Exam  BP (!) 142/80 (BP Location: Left Arm)   Pulse 71   Temp 97.6 F (36.4 C) (Oral)   Resp 16   Ht 5\' 1"  (1.549 m)   Wt 43.1 kg   SpO2 93%   BMI 17.95 kg/m  Physical Exam General: resting comfortably  ED Course / MDM   No labs in past 24 hours  Plan  Current plan is for placement.    Phineas Semen, MD 10/12/22 [phone]

## 2022-10-12 NOTE — ED Notes (Signed)
Pt provided with Coke to drink

## 2022-10-12 NOTE — ED Notes (Signed)
Patient resting quietly in room.  Watching TV.  No complaints voiced.  No needs at this time

## 2022-10-12 NOTE — ED Notes (Signed)
Patient refused lunch tray.

## 2022-10-12 NOTE — ED Notes (Signed)
Pt refused VS and snack

## 2022-10-12 NOTE — ED Notes (Signed)
Pt lying in bed in NAD. Denies any needs at this time. Will cont to monitor

## 2022-10-12 NOTE — ED Notes (Signed)
Patient resting quietly on bed.  No complaints voiced at this time.  Has remained calm and cooperative through shift.

## 2022-10-12 NOTE — ED Notes (Signed)
Pt given choc. icecream

## 2022-10-12 NOTE — ED Notes (Signed)
Pt given breakfast tray

## 2022-10-12 NOTE — ED Notes (Signed)
Pt finished with breakfast, tray disposed of. Pt denies any further needs at this time.

## 2022-10-12 NOTE — ED Notes (Signed)
Pt given dinner tray, placed on bedside table

## 2022-10-13 DIAGNOSIS — R462 Strange and inexplicable behavior: Secondary | ICD-10-CM | POA: Diagnosis not present

## 2022-10-13 MED ORDER — ENSURE MAX PROTEIN PO LIQD
11.0000 [oz_av] | Freq: Every day | ORAL | Status: DC
  Administered 2022-10-13 – 2022-10-14 (×2): 11 [oz_av] via ORAL
  Filled 2022-10-13: qty 330

## 2022-10-13 NOTE — ED Notes (Signed)
Pt provided with pm snack.

## 2022-10-13 NOTE — ED Notes (Signed)
Pt drank all of ensure and 1/3 of dinner tray.

## 2022-10-13 NOTE — ED Notes (Signed)
Pt continues to sleep; will obtain VS when pt is awake.

## 2022-10-13 NOTE — TOC Progression Note (Addendum)
Transition of Care [facility]) - Progression Note    Patient Details  Name: JIANA LEMAIRE MRN: [id number] Date of Birth: 1955-11-14  [facility]) CM/SW Contact  Liliana Cline, LCSW Phone Number: 10/13/2022, 4:57 PM  Clinical Narrative:    CSW Vernona Rieger left a VM for [employer] Case Manager at [phone] requesting return call with update.    Expected Discharge Plan: Skilled Nursing Facility Barriers to Discharge: Continued Medical Work up  Expected Discharge Plan and Services       Living arrangements for the past 2 months: Skilled Nursing Facility                                       Social Determinants of Health (SDOH) Interventions SDOH Screenings   Food Insecurity: No Food Insecurity (11/15/2021)   Received from [facility], [facility]  Transportation Needs: No Transportation Needs (02/23/2021)   Received from [facility], [facility]  Social Connections: Unknown (08/06/2021)   Received from [facility], [facility]  Tobacco Use: High Risk (09/18/2022)    Readmission Risk Interventions     No data to display

## 2022-10-13 NOTE — ED Notes (Signed)
Pt also not wanting to eat lunch. Pt encouraged to try and eat a little.

## 2022-10-13 NOTE — ED Notes (Signed)
Pt given lunch tray, placed on bedside table. Pt noted to have not eaten breakfast.

## 2022-10-13 NOTE — ED Notes (Signed)
Pt given breakfast tray

## 2022-10-13 NOTE — ED Provider Notes (Signed)
Emergency Medicine Observation Re-evaluation Note  Bonnie Schneider is a 67 y.o. female, seen on rounds today.  Pt initially presented to the ED for complaints of Psychiatric Evaluation Currently, the patient is resting comfortably.  Physical Exam  BP (!) 142/80 (BP Location: Left Arm)   Pulse 71   Temp 97.6 F (36.4 C) (Oral)   Resp 16   Ht 5\' 1"  (1.549 m)   Wt 43.1 kg   SpO2 93%   BMI 17.95 kg/m  Physical Exam Patient appears well, no acute distress, normal WOB    ED Course / MDM  EKG:   I have reviewed the labs performed to date as well as medications administered while in observation.  Recent changes in the last 24 hours include none.  Plan  Current plan is for dispo per social work.    Chesley Noon, MD 10/13/22 [phone]

## 2022-10-13 NOTE — ED Notes (Signed)
Dinner tray and beverage given to pt  

## 2022-10-14 DIAGNOSIS — R462 Strange and inexplicable behavior: Secondary | ICD-10-CM | POA: Diagnosis not present

## 2022-10-14 NOTE — ED Provider Notes (Signed)
Emergency Medicine Observation Re-evaluation Note  Bonnie Schneider is a 67 y.o. female, seen on rounds today.  Pt initially presented to the ED for complaints of Psychiatric Evaluation Currently, the patient is resting comfortably.  Physical Exam  BP 107/61 (BP Location: Right Arm)   Pulse (!) 59   Temp 97.9 F (36.6 C) (Oral)   Resp (!) 120   Ht 5\' 1"  (1.549 m)   Wt 43.1 kg   SpO2 97%   BMI 17.95 kg/m  Physical Exam Patient appears well, no acute distress, normal WOB    ED Course / MDM  EKG:   I have reviewed the labs performed to date as well as medications administered while in observation.  Recent changes in the last 24 hours include none.  Plan  Current plan is for dispo per social work.    Chesley Noon, MD 10/14/22 [phone]

## 2022-10-14 NOTE — ED Notes (Signed)
Writer tried getting pt to eat. Writer made ensure and chocolate ice cream to get pt to eat. Pt first agreed to eat it but then refused once given.

## 2022-10-14 NOTE — ED Notes (Signed)
  Patient refused PO medications. 

## 2022-10-14 NOTE — ED Notes (Signed)
Pt refused snack and drink, writer encourage pt to drink ensure that was left at beside.

## 2022-10-14 NOTE — ED Notes (Signed)
Patient refusing all medications. When asked if there was a reason she is refusing, patient states "I don't need them." Unable to convince patient of need for medications. Patient stated she would take Ensure supplement when it arrives to unit.

## 2022-10-14 NOTE — ED Notes (Signed)
Pt refused breakfast tray.  

## 2022-10-15 NOTE — TOC Progression Note (Signed)
Transition of Care [facility]) - Progression Note    Patient Details  Name: BOBETTE LEYH MRN: [id number] Date of Birth: 16-Sep-1955  [facility]) CM/SW Contact  Darleene Cleaver, [place] Phone Number: 10/15/2022, 4:50 PM  Clinical Narrative:     CSW sent email to patient's legal guardian Rebecka Apley, and also Clinical research associate Despina Pole.  Expected Discharge Plan: Skilled Nursing Facility Barriers to Discharge: Continued Medical Work up  Expected Discharge Plan and Services       Living arrangements for the past 2 months: Skilled Nursing Facility                                       Social Determinants of Health (SDOH) Interventions SDOH Screenings   Food Insecurity: No Food Insecurity (11/15/2021)   Received from [facility], [facility]  Transportation Needs: No Transportation Needs (02/23/2021)   Received from [facility], [facility]  Social Connections: Unknown (08/06/2021)   Received from [facility], [facility]  Tobacco Use: High Risk (09/18/2022)    Readmission Risk Interventions     No data to display

## 2022-10-15 NOTE — ED Notes (Signed)
Snack given.

## 2022-10-15 NOTE — ED Notes (Signed)
Dinner tray provided. Waste discarded appropriately  

## 2022-10-15 NOTE — ED Notes (Signed)
Pt given drink at this time 

## 2022-10-15 NOTE — ED Provider Notes (Signed)
Emergency Medicine Observation Re-evaluation Note  Bonnie Schneider is a 67 y.o. female, seen on rounds today.  Pt initially presented to the ED for complaints of Psychiatric Evaluation Is currently sleeping without distress  Physical Exam  BP 135/78   Pulse 72   Temp 97.6 F (36.4 C) (Oral)   Resp 16   Ht 5\' 1"  (1.549 m)   Wt 43.1 kg   SpO2 95%   BMI 17.95 kg/m  Physical Exam Resting comfortably, even unlabored respirations   ED Course / MDM  EKG:   I have reviewed the labs performed to date as well as medications administered while in observation.  Recent changes in the last 24 hours include none.  Plan  Current plan is for dispo per social work.   Sharyn Creamer, MD 10/15/22 [phone]

## 2022-10-15 NOTE — ED Notes (Signed)
Pt didn't eat lunch, pt given snack at this time

## 2022-10-16 NOTE — ED Notes (Signed)
Pm snack provided 

## 2022-10-16 NOTE — ED Notes (Signed)
Pt refusing VS at this time.

## 2022-10-16 NOTE — ED Notes (Signed)
Breakfast tray offered to patient. Pt refuses to eat.

## 2022-10-16 NOTE — ED Notes (Signed)
Pt refused to eat lunch and dinner. Pt has been asleep

## 2022-10-16 NOTE — ED Notes (Signed)
Offered pt lunch tray. Refused.

## 2022-10-16 NOTE — ED Notes (Signed)
Offered patient dinner tray, refused.

## 2022-10-16 NOTE — ED Provider Notes (Signed)
Emergency Medicine Observation Re-evaluation Note  SINTIA MCKISSIC is a 67 y.o. female, seen on rounds today.  Pt initially presented to the ED for complaints of Psychiatric Evaluation  Currently, the patient is is no acute distress. Denies any concerns at this time.  Physical Exam  Blood pressure (!) 154/77, pulse 70, temperature (!) 97.4 F (36.3 C), temperature source Oral, resp. rate 12, height 5\' 1"  (1.549 m), weight 43.1 kg, SpO2 96%.  Physical Exam: General: No apparent distress Pulm: Normal WOB Psych: Resting comfortably     ED Course / MDM     I have reviewed the labs performed to date as well as medications administered while in observation.  Recent changes in the last 24 hours include: No acute events overnight.  Plan   Current plan: Patient awaiting social work disposition    Corena Herter, MD 10/16/22 1722

## 2022-10-16 NOTE — ED Notes (Signed)
Pt refusing AM medications.

## 2022-10-17 ENCOUNTER — Encounter: Payer: Self-pay | Admitting: Emergency Medicine

## 2022-10-17 DIAGNOSIS — R462 Strange and inexplicable behavior: Secondary | ICD-10-CM

## 2022-10-17 NOTE — Consult Note (Signed)
[facility] Face-to-Face Psychiatry Consult   Reason for Consult:  Behavior Issues Referring Physician:  Willy Eddy MD Patient Identification: Bonnie Schneider MRN:  [id number] Principal Diagnosis: <principal problem not specified> Diagnosis:  Active Problems:   Bizarre behavior   Total Time spent with patient: 15 minutes  Subjective:   Bonnie Schneider is a 67 y.o. female patient seen due to behavior issues per consult request.   HPI:  Bonnie Schneider is a 67 yo female seen due to reports of behavior issues. Patient noted sitting in the bed, watching television and eating pizza. Patient reports a good mood. She was compliant with medications today. Per her medical record, patient has refused meals and medication yesterday.  Patient calm. Patient noted smiling, in no acute mental distress. She denies SI/HI/AVH/paranoia or delusional thought.  Past Psychiatric History: bizarre behavior  Risk to Self:  denies Risk to Others:  denies Prior Inpatient Therapy:   Prior Outpatient Therapy:    Past Medical History:  Past Medical History:  Diagnosis Date   Chronic back pain    COPD (chronic obstructive pulmonary disease) (HCC)    Depression    GERD (gastroesophageal reflux disease)    Hypertension    Immature cataract    Migraine    last migraine 11/07/11   Shortness of breath    with exertion   Vascular dementia [facility])     Past Surgical History:  Procedure Laterality Date   CATARACT EXTRACTION W/PHACO Right 01/18/2013   Procedure: CATARACT EXTRACTION PHACO AND INTRAOCULAR LENS PLACEMENT (IOC);  Surgeon: Gemma Payor, MD;  Location: [facility] ORS;  Service: Ophthalmology;  Laterality: Right;  CDE:12.85   CATARACT EXTRACTION W/PHACO Left 01/28/2013   Procedure: CATARACT EXTRACTION PHACO AND INTRAOCULAR LENS PLACEMENT (IOC);  Surgeon: Gemma Payor, MD;  Location: [facility] ORS;  Service: Ophthalmology;  Laterality: Left;  CDE:7.93   CHOLECYSTECTOMY     COLONOSCOPY  11/08/2011   Procedure: COLONOSCOPY;   Surgeon: Malissa Hippo, MD;  Location: [facility] ENDO SUITE;  Service: Endoscopy;  Laterality: N/A;  12:15   Family History: History reviewed. No pertinent family history. Family Psychiatric  History: none Social History:  Social History   Substance and Sexual Activity  Alcohol Use No     Social History   Substance and Sexual Activity  Drug Use No    Social History   Socioeconomic History   Marital status: Divorced    Spouse name: Not on file   Number of children: Not on file   Years of education: Not on file   Highest education level: Not on file  Occupational History   Not on file  Tobacco Use   Smoking status: Every Day    Current packs/day: 1.00    Average packs/day: 1 pack/day for 40.0 years (40.0 ttl pk-yrs)    Types: Cigarettes   Smokeless tobacco: Current   Tobacco comments:    1 pack a day  Substance and Sexual Activity   Alcohol use: No   Drug use: No   Sexual activity: Not on file  Other Topics Concern   Not on file  Social History Narrative   Not on file   Social Determinants of Health   Financial Resource Strain: Not on file  Food Insecurity: No Food Insecurity (11/15/2021)   Received from [facility], [facility]   Hunger Vital Sign    Worried About Running Out of Food in the Last Year: Never true    Ran Out of Food in the Last Year: Never  true  Transportation Needs: No Transportation Needs (02/23/2021)   Received from [facility], [facility]   [facility] - Transportation    Lack of Transportation (Medical): No    Lack of Transportation (Non-Medical): No  Physical Activity: Not on file  Stress: Not on file  Social Connections: Unknown (08/06/2021)   Received from [facility], [facility]   Social Network    Social Network: Not on file   Additional Social History:    Allergies:  No Known Allergies  Labs: No results found for this or any previous visit (from the past 48 hour(s)).  Current Facility-Administered Medications  Medication  Dose Route Frequency Provider Last Rate Last Admin   amitriptyline (ELAVIL) tablet 150 mg  150 mg Oral QHS Delton Prairie, MD       amLODipine (NORVASC) tablet 10 mg  10 mg Oral Daily Sharman Cheek, MD   10 mg at 09/21/22 5409   aspirin chewable tablet 81 mg  81 mg Oral Daily Delton Prairie, MD   81 mg at 09/21/22 0905   clopidogrel (PLAVIX) tablet 75 mg  75 mg Oral Daily Delton Prairie, MD   75 mg at 09/21/22 8119   protein supplement (ENSURE MAX) liquid  11 oz Oral Daily Pilar Jarvis, MD   11 oz at 10/14/22 1141   rosuvastatin (CRESTOR) tablet 40 mg  40 mg Oral QHS Delton Prairie, MD       senna-docusate (Senokot-S) tablet 2 tablet  2 tablet Oral BID Delton Prairie, MD   2 tablet at 09/21/22 0906   Current Outpatient Medications  Medication Sig Dispense Refill   aspirin 81 MG chewable tablet Chew 81 mg by mouth daily.     clopidogrel (PLAVIX) 75 MG tablet Take 75 mg by mouth daily.     mirtazapine (REMERON) 7.5 MG tablet Take 7.5 mg by mouth at bedtime.     rosuvastatin (CRESTOR) 40 MG tablet Take 40 mg by mouth at bedtime.     senna-docusate (SENOKOT-S) 8.6-50 MG tablet Take 2 tablets by mouth 2 (two) times daily.     alprazolam (XANAX) 2 MG tablet Take 2 mg by mouth 4 (four) times daily. Anxiety (Patient not taking: Reported on 09/17/2022)     amitriptyline (ELAVIL) 50 MG tablet Take 150 mg by mouth at bedtime.  (Patient not taking: Reported on 09/17/2022)     enalapril (VASOTEC) 20 MG tablet TAKE 1 TABLET BY MOUTH EVERY DAY (Patient not taking: Reported on 09/17/2022) 30 tablet 0   furosemide (LASIX) 20 MG tablet Take 20 mg by mouth daily as needed for fluid.  (Patient not taking: Reported on 09/17/2022)     hydrochlorothiazide (HYDRODIURIL) 25 MG tablet Take 25 mg by mouth daily. (Patient not taking: Reported on 09/17/2022)     HYDROcodone-acetaminophen (NORCO) 10-325 MG per tablet Take 1 tablet by mouth every 6 (six) hours as needed for pain. (Patient not taking: Reported on 09/17/2022)     Omega-3  Fatty Acids (FISH OIL) 1000 MG CAPS Take 3,000 mg by mouth daily. (Patient not taking: Reported on 09/17/2022)     pantoprazole (PROTONIX) 40 MG tablet Take 40 mg by mouth daily. (Patient not taking: Reported on 09/17/2022)     rizatriptan (MAXALT) 10 MG tablet Take 10 mg by mouth as needed. May repeat in 2 hours if needed (Patient not taking: Reported on 09/17/2022)     traZODone (DESYREL) 100 MG tablet Take 100 mg by mouth at bedtime. (Patient not taking: Reported on 09/17/2022)  Musculoskeletal: Strength & Muscle Tone:  n/a Gait & Station:  n/a Patient leans: N/A            Psychiatric Specialty Exam:  Presentation  General Appearance:  Appropriate for Environment  Eye Contact: Good  Speech: Clear and Coherent  Speech Volume: Normal  Handedness:No data recorded  Mood and Affect  Mood: Euthymic  Affect: Congruent   Thought Process  Thought Processes: Goal Directed  Descriptions of Associations:Intact  Orientation:Partial  Thought Content:Scattered  History of Schizophrenia/Schizoaffective disorder:No data recorded Duration of Psychotic Symptoms:No data recorded Hallucinations:Hallucinations: None  Ideas of Reference:None  Suicidal Thoughts:Suicidal Thoughts: No  Homicidal Thoughts:Homicidal Thoughts: No   Sensorium  Memory: Immediate Good  Judgment: Fair  Insight: Poor   Executive Functions  Concentration: Fair  Attention Span: Fair  Recall: Poor  Fund of Knowledge:No data recorded Language:No data recorded  Psychomotor Activity  Psychomotor Activity:No data recorded  Assets  Assets: Communication Skills   Sleep  Sleep:No data recorded  Physical Exam: Physical Exam Vitals reviewed.  Neurological:     Mental Status: She is disoriented.    Review of Systems  Psychiatric/Behavioral:  Positive for memory loss.   All other systems reviewed and are negative.  Blood pressure 125/85, pulse 79, temperature 97.6  F (36.4 C), temperature source Axillary, resp. rate 16, height 5\' 1"  (1.549 m), weight 43.1 kg, SpO2 95%. Body mass index is 17.95 kg/m.  Treatment Plan Summary: Plan Patient to continue Elavil has ordered. No medication changes at this time.  Disposition: No evidence of imminent risk to self or others at present.    Mcneil Sober, NP 10/17/2022 5:59 PM

## 2022-10-17 NOTE — ED Provider Notes (Signed)
Emergency Medicine Observation Re-evaluation Note  Bonnie Schneider is a 67 y.o. female, currently boarding in the emergency department.  No acute change since last update.  Physical Exam  BP 125/85   Pulse 79   Temp 97.6 F (36.4 C) (Axillary)   Resp 16   Ht 5\' 1"  (1.549 m)   Wt 43.1 kg   SpO2 95%   BMI 17.95 kg/m    ED Course / MDM   No recent lab work available for review. Plan  Current plan is for placement to an appropriate living facility once available.  Social worker is currently working with the patient to achieve this.    [name], MD 10/17/22 2115

## 2022-10-17 NOTE — ED Notes (Signed)
Pt given warm blankets and cola

## 2022-10-17 NOTE — ED Provider Notes (Signed)
-----------------------------------------   5:43 AM on 10/17/2022 -----------------------------------------   Blood pressure (!) 154/90, pulse 60, temperature (!) 97.4 F (36.3 C), temperature source Oral, resp. rate 18, height 5\' 1"  (1.549 m), weight 43.1 kg, SpO2 96%.  The patient is calm and cooperative at this time.  There have been no acute events since the last update.  Awaiting disposition plan from Social Work team.   Irean Hong, MD 10/17/22 [phone]

## 2022-10-17 NOTE — Evaluation (Signed)
Physical Therapy Evaluation Patient Details Name: Bonnie Schneider MRN: [id number] DOB: Aug 11, 1955 Today's Date: 10/17/2022  History of Present Illness  Patient is a 67 year old female who initially presented to the ED for complaints of Psychiatric Evaluation from [facility]. History of hypertension, depression, COPD, previous stroke.  Clinical Impression  Patient is agreeable to PT. She has expressive language deficits. Patient reports she is independent at baseline but has a history of stroke with right side deficits.  The patient had mild staggering to the right with hallway ambulation that is self corrected with Min guard provided for safety. Berg Balance score of 40, with scores between 37-45 = Significant (>80%) fall risk. The patient expressed motivation to participate with PT while in the hospital. Recommend PT follow up to maximize independence and decrease fall risk.       Assistance Recommended at Discharge Intermittent Supervision/Assistance  If plan is discharge home, recommend the following:  Can travel by private vehicle  A little help with walking and/or transfers;A little help with bathing/dressing/bathroom;Help with stairs or ramp for entrance;Assist for transportation;Assistance with cooking/housework;Direct supervision/assist for medications management   Yes    Equipment Recommendations None recommended by PT  Recommendations for Other Services       Functional Status Assessment Patient has had a recent decline in their functional status and demonstrates the ability to make significant improvements in function in a reasonable and predictable amount of time.     Precautions / Restrictions Precautions Precautions: Fall Restrictions Weight Bearing Restrictions: No      Mobility  Bed Mobility Overal bed mobility: Modified Independent                  Transfers Overall transfer level: Needs assistance Equipment used: None Transfers: Sit to/from  Stand Sit to Stand: Supervision           General transfer comment: supervision provided for safety. increased time required    Ambulation/Gait Ambulation/Gait assistance: Min guard, Supervision Gait Distance (Feet): 150 Feet Assistive device: None Gait Pattern/deviations: Step-through pattern, Staggering right       General Gait Details: mild loss of balance with hallway ambulation that is self corrected. Min guard provided for safety but patient prefers threapist not touch her.  Stairs            Wheelchair Mobility     Tilt Bed    Modified Rankin (Stroke Patients Only)       Balance Overall balance assessment: Needs assistance Sitting-balance support: Feet supported Sitting balance-Leahy Scale: Good     Standing balance support: No upper extremity supported Standing balance-Leahy Scale: Fair                   Standardized Balance Assessment Standardized Balance Assessment : Berg Balance Test Berg Balance Test Sit to Stand: Able to stand without using hands and stabilize independently Standing Unsupported: Able to stand safely 2 minutes Sitting with Back Unsupported but Feet Supported on Floor or Stool: Able to sit safely and securely 2 minutes Stand to Sit: Sits safely with minimal use of hands Transfers: Able to transfer safely, minor use of hands Standing Unsupported with Eyes Closed: Able to stand 10 seconds with supervision Standing Ubsupported with Feet Together: Able to place feet together independently and stand for 1 minute with supervision From Standing, Reach Forward with Outstretched Arm: Can reach forward >5 cm safely (2") From Standing Position, Pick up Object from Floor: Able to pick up shoe, needs supervision From Standing  Position, Turn to Look Behind Over each Shoulder: Looks behind one side only/other side shows less weight shift Turn 360 Degrees: Able to turn 360 degrees safely but slowly Standing Unsupported, Alternately Place  Feet on Step/Stool: Able to complete >2 steps/needs minimal assist Standing Unsupported, One Foot in Front: Needs help to step but can hold 15 seconds Standing on One Leg: Able to lift leg independently and hold equal to or more than 3 seconds Total Score: 40         Pertinent Vitals/Pain Pain Assessment Pain Assessment: No/denies pain    Home Living Family/patient expects to be discharged to:: Skilled nursing facility                        Prior Function Prior Level of Function : Independent/Modified Independent             Mobility Comments: patient is a poor historian but reports she is independent with mobility at baseline       Hand Dominance   Dominant Hand: Left (patient indicated she is L hand dominant (unsure if this is accurate))    Extremity/Trunk Assessment   Upper Extremity Assessment Upper Extremity Assessment: RUE deficits/detail RUE Deficits / Details: history of stroke. RUE with delayed movement compared to LUE. grip strength impaired compared to LUE    Lower Extremity Assessment Lower Extremity Assessment: RLE deficits/detail RLE Deficits / Details: dorsiflexion 3/5. patient reports sensation is intact       Communication   Communication: Expressive difficulties  Cognition Arousal/Alertness: Awake/alert Behavior During Therapy: WFL for tasks assessed/performed Overall Cognitive Status: Difficult to assess                                 General Comments: Patient is a poor historian. Expressive deficits present and history of stroke per chart. Patient is able to follow single step commands consistently and is cooperative throughout session        General Comments      Exercises     Assessment/Plan    PT Assessment Patient needs continued PT services  PT Problem List Decreased balance;Decreased mobility;Decreased safety awareness       PT Treatment Interventions DME instruction;Gait training;Stair  training;Functional mobility training;Therapeutic activities;Therapeutic exercise;Balance training;Neuromuscular re-education;Cognitive remediation;Patient/family education    PT Goals (Current goals can be found in the Care Plan section)  Acute Rehab PT Goals Patient Stated Goal: to work with physical therapy PT Goal Formulation: With patient Time For Goal Achievement: 10/31/22 Potential to Achieve Goals: Fair Additional Goals Additional Goal #1: Will improve Berg Balance Score to 44 to decrease risk for falls    Frequency Min 1X/week     Co-evaluation               AM-PAC PT "6 Clicks" Mobility  Outcome Measure Help needed turning from your back to your side while in a flat bed without using bedrails?: None Help needed moving from lying on your back to sitting on the side of a flat bed without using bedrails?: A Little Help needed moving to and from a bed to a chair (including a wheelchair)?: A Little Help needed standing up from a chair using your arms (e.g., wheelchair or bedside chair)?: A Little Help needed to walk in hospital room?: A Little Help needed climbing 3-5 steps with a railing? : A Little 6 Click Score: 19    End of Session  Activity Tolerance: Patient tolerated treatment well Patient left: in bed;with call bell/phone within reach Nurse Communication: Mobility status PT Visit Diagnosis: Difficulty in walking, not elsewhere classified (R26.2);Other abnormalities of gait and mobility (R26.89)    Time: 5409-8119 PT Time Calculation (min) (ACUTE ONLY): 13 min   Charges:   PT Evaluation $PT Eval Low Complexity: 1 Low   PT General Charges $$ ACUTE PT VISIT: 1 Visit         Donna Bernard, PT, MPT   Ina Homes 10/17/2022, 12:57 PM

## 2022-10-17 NOTE — ED Notes (Signed)
Pt was given breakfast menu and assisted on the items she would like to have on breakfast to encourage additional nutritional needs. Pt chose scrambled eggs, Cherrios, biscuit, cranberry juice, and ghrape juice. In addition double portion of scrambled eggs was ordered including a carton of whole milk. Pt was asked if she has any needs at this time and voiced no needs required at this time. Will continue to monitor.

## 2022-10-17 NOTE — ED Notes (Signed)
Pt resting in bed, denies needs.

## 2022-10-17 NOTE — [place] FL2 (Addendum)
[place]ICA[place]2 LEVEL OF CARE FORM     IDENTIFICATION  Patient Name: Bonnie Schneider Birthdate: 1956-03-05 Sex: female Admission Date (Current Location): 09/17/2022  [place] and [place] Number:  [address] [id number] [facility] Facility and Address:  [facility], [address], [place], [place] [address]      Provider Number: [id number]  Attending Physician Name and Address:  Willy Eddy, MD  Relative Name and Phone Number:  Rebecka Apley Legal Guardian from [facility] DSS  [phone]    Current Level of Care: Hospital Recommended Level of Care: Skilled Nursing Facility Prior Approval Number:    Date Approved/Denied:   PASRR Number: [id number]  Discharge Plan: Other (Comment) ([facility])    Current Diagnoses: Patient Active Problem List   Diagnosis Date Noted   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   Hypocortisolemia (HCC) 02/19/2016   Hypertension 10/21/2011   Chronic back pain 10/21/2011   Encounter for screening colonoscopy 10/21/2011   Bloating 10/21/2011    Orientation RESPIRATION BLADDER Height & Weight     Self, Place, Time, Situation  Normal Incontinent Weight: 95 lb (43.1 kg) Height:  5\' 1"  (154.9 cm)  BEHAVIORAL SYMPTOMS/MOOD NEUROLOGICAL BOWEL NUTRITION STATUS      Continent Diet (see discharge summary)  AMBULATORY STATUS COMMUNICATION OF NEEDS Skin   Limited Assist Verbally Normal                       Personal Care Assistance Level of Assistance  Bathing, Dressing, Feeding, Total care Bathing Assistance: Limited assistance Feeding assistance: Independent Dressing Assistance: Limited assistance Total Care Assistance: Limited assistance   Functional Limitations Info  Sight, Hearing, Speech Sight Info: Adequate Hearing Info: Adequate Speech Info: Adequate    SPECIAL CARE FACTORS FREQUENCY  OT (By licensed OT), PT (By licensed PT)     PT Frequency: Minimum 5x a week OT Frequency: Minimum 5x a week             Contractures Contractures Info: Not present    Additional Factors Info  Code Status, Allergies, Psychotropic Code Status Info: full Allergies Info: NKA Psychotropic Info: amitriptyline (ELAVIL) tablet 150 mg         Current Medications (10/17/2022):  This is the current hospital active medication list Current Facility-Administered Medications  Medication Dose Route Frequency Provider Last Rate Last Admin   amitriptyline (ELAVIL) tablet 150 mg  150 mg Oral QHS Delton Prairie, MD       amLODipine (NORVASC) tablet 10 mg  10 mg Oral Daily Sharman Cheek, MD   10 mg at 09/21/22 0272   aspirin chewable tablet 81 mg  81 mg Oral Daily Delton Prairie, MD   81 mg at 09/21/22 0905   clopidogrel (PLAVIX) tablet 75 mg  75 mg Oral Daily Delton Prairie, MD   75 mg at 09/21/22 5366   protein supplement (ENSURE MAX) liquid  11 oz Oral Daily Pilar Jarvis, MD   11 oz at 10/14/22 1141   rosuvastatin (CRESTOR) tablet 40 mg  40 mg Oral QHS Delton Prairie, MD       senna-docusate (Senokot-S) tablet 2 tablet  2 tablet Oral BID Delton Prairie, MD   2 tablet at 09/21/22 0906   Current Outpatient Medications  Medication Sig Dispense Refill   aspirin 81 MG chewable tablet Chew 81 mg by mouth daily.     clopidogrel (PLAVIX) 75 MG tablet Take 75 mg by mouth daily.     mirtazapine (REMERON) 7.5 MG tablet Take 7.5  mg by mouth at bedtime.     rosuvastatin (CRESTOR) 40 MG tablet Take 40 mg by mouth at bedtime.     senna-docusate (SENOKOT-S) 8.6-50 MG tablet Take 2 tablets by mouth 2 (two) times daily.     alprazolam (XANAX) 2 MG tablet Take 2 mg by mouth 4 (four) times daily. Anxiety (Patient not taking: Reported on 09/17/2022)     amitriptyline (ELAVIL) 50 MG tablet Take 150 mg by mouth at bedtime.  (Patient not taking: Reported on 09/17/2022)     enalapril (VASOTEC) 20 MG tablet TAKE 1 TABLET BY MOUTH EVERY DAY (Patient not taking: Reported on 09/17/2022) 30 tablet 0   furosemide (LASIX) 20 MG tablet Take 20 mg  by mouth daily as needed for fluid.  (Patient not taking: Reported on 09/17/2022)     hydrochlorothiazide (HYDRODIURIL) 25 MG tablet Take 25 mg by mouth daily. (Patient not taking: Reported on 09/17/2022)     HYDROcodone-acetaminophen (NORCO) 10-325 MG per tablet Take 1 tablet by mouth every 6 (six) hours as needed for pain. (Patient not taking: Reported on 09/17/2022)     Omega-3 Fatty Acids (FISH OIL) 1000 MG CAPS Take 3,000 mg by mouth daily. (Patient not taking: Reported on 09/17/2022)     pantoprazole (PROTONIX) 40 MG tablet Take 40 mg by mouth daily. (Patient not taking: Reported on 09/17/2022)     rizatriptan (MAXALT) 10 MG tablet Take 10 mg by mouth as needed. May repeat in 2 hours if needed (Patient not taking: Reported on 09/17/2022)     traZODone (DESYREL) 100 MG tablet Take 100 mg by mouth at bedtime. (Patient not taking: Reported on 09/17/2022)       Discharge Medications: Please see discharge summary for a list of discharge medications.  Relevant Imaging Results:  Relevant Lab Results:   Additional Information SSN: [id number]  Darleene Cleaver, LCSW

## 2022-10-17 NOTE — ED Notes (Signed)
Pt is in sitting up in bed watching television. Pt is in no distress. Pt ate approx. 60% of her cheese pizza and consumed her 1 can of cola.

## 2022-10-17 NOTE — ED Notes (Signed)
Pt is sitting up in bed watching television. Pt breakfast tray was removed after pt stated she was done with it. Pt ate approx. 50 percent of protein, 50 percent of his biscuit, 1 cup of cranberry juice, 1 bottle of ensure.

## 2022-10-18 NOTE — ED Notes (Signed)
Pt resting in bed at this time. No complaints.  

## 2022-10-18 NOTE — ED Notes (Signed)
Pt denies needs at this time.  

## 2022-10-18 NOTE — ED Notes (Signed)
Pt ate 75% of fish sandwich

## 2022-10-18 NOTE — TOC Progression Note (Addendum)
Transition of Care [facility]) - Progression Note    Patient Details  Name: Bonnie Schneider MRN: [id number] Date of Birth: 02/16/1956  Transition of Care [facility]) CM/SW Contact  Darleene Cleaver, [place] Phone Number: 10/18/2022, 11:00 AM  Clinical Narrative:     CSW received an email from patient's legal guardian Rebecka Apley.  She is requesting a copy of the updated FL2 to look for placement options for her.  CSW completed FL2 and will email to [place].  Per Judeth Cornfield she will be visiting patient today in the ED to see how she is doing.  CSW to continue looking for placement for patient.  [facility] is reviewing patient, and updated clinicals were sent to other SNFs awaiting bed offers.  3:30pm Email received from legal guardian, FL2 needs to be changed to ALF FL2.  CSW updated FL2 and sent to legal guardian.   Expected Discharge Plan: Skilled Nursing Facility Barriers to Discharge: Continued Medical Work up  Expected Discharge Plan and Services       Living arrangements for the past 2 months: Skilled Nursing Facility                                       Social Determinants of Health (SDOH) Interventions SDOH Screenings   Food Insecurity: No Food Insecurity (11/15/2021)   Received from [facility], [facility]  Transportation Needs: No Transportation Needs (02/23/2021)   Received from [facility], [facility]  Social Connections: Unknown (08/06/2021)   Received from [facility], [facility]  Tobacco Use: High Risk (10/17/2022)    Readmission Risk Interventions     No data to display

## 2022-10-18 NOTE — ED Notes (Signed)
Pt refused pulse ox check and to take morning medicines.

## 2022-10-18 NOTE — NC FL2 (Signed)
[place] MEDICAID FL2 LEVEL OF CARE FORM     IDENTIFICATION  Patient Name: Bonnie Schneider Birthdate: 06-Jan-1956 Sex: female Admission Date (Current Location): 09/17/2022  [place] and [place] Number:  [address] [id number] [facility] Facility and Address:  [facility], [address], [place], [place] [address]      Provider Number: [id number]  Attending Physician Name and Address:  Willy Eddy, MD  Relative Name and Phone Number:  Rebecka Apley Legal Guardian from [facility] DSS  [phone]    Current Level of Care: Hospital Recommended Level of Care: Assisted Living Facility Prior Approval Number:    Date Approved/Denied:   PASRR Number: [id number]  Discharge Plan: Other (Comment) (Assisted Living Facility)    Current Diagnoses: Patient Active Problem List   Diagnosis Date Noted   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   Hypocortisolemia (HCC) 02/19/2016   Hypertension 10/21/2011   Chronic back pain 10/21/2011   Encounter for screening colonoscopy 10/21/2011   Bloating 10/21/2011    Orientation RESPIRATION BLADDER Height & Weight     Self, Place, Time, Situation  Normal Incontinent Weight: 95 lb (43.1 kg) Height:  5\' 1"  (154.9 cm)  BEHAVIORAL SYMPTOMS/MOOD NEUROLOGICAL BOWEL NUTRITION STATUS      Continent Diet (see discharge summary)  AMBULATORY STATUS COMMUNICATION OF NEEDS Skin   Supervision Verbally Normal                       Personal Care Assistance Level of Assistance  Bathing, Dressing, Feeding, Total care Bathing Assistance: Limited assistance Feeding assistance: Independent Dressing Assistance: Limited assistance Total Care Assistance: Limited assistance   Functional Limitations Info  Sight, Hearing, Speech Sight Info: Adequate Hearing Info: Adequate Speech Info: Adequate    SPECIAL CARE FACTORS FREQUENCY                       Contractures Contractures Info: Not present    Additional Factors Info   Code Status, Allergies, Psychotropic Code Status Info: full Allergies Info: NKA Psychotropic Info: amitriptyline (ELAVIL) tablet 150 mg         Current Medications (10/18/2022):  This is the current hospital active medication list Current Facility-Administered Medications  Medication Dose Route Frequency Provider Last Rate Last Admin   amitriptyline (ELAVIL) tablet 150 mg  150 mg Oral QHS Delton Prairie, MD       amLODipine (NORVASC) tablet 10 mg  10 mg Oral Daily Sharman Cheek, MD   10 mg at 09/21/22 0301   aspirin chewable tablet 81 mg  81 mg Oral Daily Delton Prairie, MD   81 mg at 09/21/22 0905   clopidogrel (PLAVIX) tablet 75 mg  75 mg Oral Daily Delton Prairie, MD   75 mg at 09/21/22 3143   protein supplement (ENSURE MAX) liquid  11 oz Oral Daily Pilar Jarvis, MD   11 oz at 10/14/22 1141   rosuvastatin (CRESTOR) tablet 40 mg  40 mg Oral QHS Delton Prairie, MD       senna-docusate (Senokot-S) tablet 2 tablet  2 tablet Oral BID Delton Prairie, MD   2 tablet at 09/21/22 0906   Current Outpatient Medications  Medication Sig Dispense Refill   aspirin 81 MG chewable tablet Chew 81 mg by mouth daily.     clopidogrel (PLAVIX) 75 MG tablet Take 75 mg by mouth daily.     mirtazapine (REMERON) 7.5 MG tablet Take 7.5 mg by mouth at bedtime.     rosuvastatin (CRESTOR) 40 MG tablet Take  40 mg by mouth at bedtime.     senna-docusate (SENOKOT-S) 8.6-50 MG tablet Take 2 tablets by mouth 2 (two) times daily.     alprazolam (XANAX) 2 MG tablet Take 2 mg by mouth 4 (four) times daily. Anxiety (Patient not taking: Reported on 09/17/2022)     amitriptyline (ELAVIL) 50 MG tablet Take 150 mg by mouth at bedtime.  (Patient not taking: Reported on 09/17/2022)     enalapril (VASOTEC) 20 MG tablet TAKE 1 TABLET BY MOUTH EVERY DAY (Patient not taking: Reported on 09/17/2022) 30 tablet 0   furosemide (LASIX) 20 MG tablet Take 20 mg by mouth daily as needed for fluid.  (Patient not taking: Reported on 09/17/2022)      hydrochlorothiazide (HYDRODIURIL) 25 MG tablet Take 25 mg by mouth daily. (Patient not taking: Reported on 09/17/2022)     HYDROcodone-acetaminophen (NORCO) 10-325 MG per tablet Take 1 tablet by mouth every 6 (six) hours as needed for pain. (Patient not taking: Reported on 09/17/2022)     Omega-3 Fatty Acids (FISH OIL) 1000 MG CAPS Take 3,000 mg by mouth daily. (Patient not taking: Reported on 09/17/2022)     pantoprazole (PROTONIX) 40 MG tablet Take 40 mg by mouth daily. (Patient not taking: Reported on 09/17/2022)     rizatriptan (MAXALT) 10 MG tablet Take 10 mg by mouth as needed. May repeat in 2 hours if needed (Patient not taking: Reported on 09/17/2022)     traZODone (DESYREL) 100 MG tablet Take 100 mg by mouth at bedtime. (Patient not taking: Reported on 09/17/2022)       Discharge Medications: Please see discharge summary for a list of discharge medications.  Relevant Imaging Results:  Relevant Lab Results:   Additional Information SSN: [id number]  Darleene Cleaver, LCSW

## 2022-10-19 NOTE — ED Provider Notes (Signed)
-----------------------------------------   5:26 AM on 10/19/2022 -----------------------------------------   Blood pressure (!) 141/79, pulse 70, temperature 97.8 F (36.6 C), temperature source Oral, resp. rate 15, height 5\' 1"  (1.549 m), weight 43.1 kg, SpO2 95%.  The patient is calm and cooperative at this time.  There have been no acute events since the last update.  Awaiting disposition plan from Social Work team.   Irean Hong, MD 10/19/22 [phone]

## 2022-10-19 NOTE — ED Notes (Signed)
Pt is resting in bed. Attempted to encourage pt to eat untouched dinner at bedside. Pt refused. Pt provided with warm blankets. Requested lights off. Pt denies any additional needs

## 2022-10-19 NOTE — ED Notes (Signed)
Pt was cooperative with night time vital signs. Offered pt scheduled medications to which pt refused. Denies any additional needs.

## 2022-10-19 NOTE — ED Notes (Signed)
Patient in bed resting. 

## 2022-10-19 NOTE — ED Notes (Signed)
Breakfast tray given. °

## 2022-10-20 NOTE — ED Notes (Signed)
  Patient refused night time medications.  Offered assistance to restroom and food but patient also refused.  Patient states she would call for me if she needed something.

## 2022-10-20 NOTE — TOC CM/SW Note (Signed)
CSW reached out to [facility] twice, they have not answered the phone just rings.  Layla Barter, LCSWA TOC-Weekends [phone]

## 2022-10-21 DIAGNOSIS — R462 Strange and inexplicable behavior: Secondary | ICD-10-CM | POA: Diagnosis not present

## 2022-10-21 NOTE — ED Notes (Signed)
Pt walking with PT. 

## 2022-10-21 NOTE — ED Notes (Signed)
Patient given coke. 

## 2022-10-21 NOTE — ED Provider Notes (Signed)
Emergency Medicine Observation Re-evaluation Note  Bonnie Schneider is a 67 y.o. female, seen on rounds today.  Pt initially presented to the ED for complaints of Psychiatric Evaluation  Currently, the patient is calm, no acute complaints.  Physical Exam  Blood pressure 133/84, pulse 68, temperature (!) 97.4 F (36.3 C), resp. rate 16, height 5\' 1"  (1.549 m), weight 43.1 kg, SpO2 98%. Physical Exam General: NAD Lungs: CTAB Psych: not agitated  ED Course / MDM  EKG:    I have reviewed the labs performed to date as well as medications administered while in observation.  Recent changes in the last 24 hours include no acute events overnight.    Plan  Current plan is for TOC placement.   Sharman Cheek, MD 10/21/22 0730

## 2022-10-21 NOTE — TOC Progression Note (Addendum)
Transition of Care [facility]) - Progression Note    Patient Details  Name: Bonnie Schneider MRN: [id number] Date of Birth: 06-27-1955  Transition of Care [facility]) CM/SW Contact  Kreg Shropshire, RN Phone Number: 10/21/2022, 2:12 PM  Clinical Narrative:    Cm LVM for Dora Sims of admissions of [facility] SN[facility] to see if they are still cocdering pt. Awaiting call back  1422- Donata Clay of [facility] stated that they do not have any availability at this time. PT has hx of behavior issues.  1530- Sent message to Tammy of [facility] who is over admissions with [facility] and [place]. LVM for Shanna at [facility] admissions. LVM with Lolita Lenz, SW of [place]. Cm awaiting to hear back.  1600-[facility] and [address] with [facility] denied pt.  Expected Discharge Plan: Skilled Nursing Facility Barriers to Discharge: Continued Medical Work up  Expected Discharge Plan and Services       Living arrangements for the past 2 months: Skilled Nursing Facility                                       Social Determinants of Health (SDOH) Interventions SDOH Screenings   Food Insecurity: No Food Insecurity (11/15/2021)   Received from [facility], [facility]  Transportation Needs: No Transportation Needs (02/23/2021)   Received from [facility], [facility]  Social Connections: Unknown (08/06/2021)   Received from [facility], [facility]  Tobacco Use: High Risk (10/17/2022)    Readmission Risk Interventions     No data to display

## 2022-10-21 NOTE — Progress Notes (Signed)
Physical Therapy Treatment Patient Details Name: Bonnie Schneider MRN: [id number] DOB: March 10, 1956 Today's Date: 10/21/2022   History of Present Illness Patient is a 67 year old female who initially presented to the ED for complaints of Psychiatric Evaluation from [facility]. History of hypertension, depression, COPD, previous stroke.    PT Comments  Pt received in bed, lights off in room, easily awakened. Pt motivated to participate in PT session. Demonstrates Supervision for bed mobility and transfers. Increased gait distance on level surface to 185' without AD or physical assist for balance. Pt able to maneuver around objects and change directions without loss of balance. Overall appears improved since initial eval. Will continue to progress per POC.   If plan is discharge home, recommend the following: A little help with walking and/or transfers;A little help with bathing/dressing/bathroom;Help with stairs or ramp for entrance;Assist for transportation;Assistance with cooking/housework;Direct supervision/assist for medications management   Can travel by private vehicle     Yes  Equipment Recommendations  None recommended by PT    Recommendations for Other Services       Precautions / Restrictions Precautions Precautions: Fall Restrictions Weight Bearing Restrictions: No     Mobility  Bed Mobility Overal bed mobility: Modified Independent                  Transfers Overall transfer level: Needs assistance Equipment used: None Transfers: Sit to/from Stand Sit to Stand: Supervision                Ambulation/Gait Ambulation/Gait assistance: Supervision Gait Distance (Feet): 185 Feet Assistive device: None Gait Pattern/deviations: Step-through pattern, Narrow base of support       General Gait Details: No LOB noted   Stairs             Wheelchair Mobility     Tilt Bed    Modified Rankin (Stroke Patients Only)       Balance Overall  balance assessment: Needs assistance Sitting-balance support: Feet supported Sitting balance-Leahy Scale: Good     Standing balance support: No upper extremity supported Standing balance-Leahy Scale: Fair                              Cognition Arousal/Alertness: Awake/alert Behavior During Therapy: WFL for tasks assessed/performed Overall Cognitive Status: Difficult to assess                                 General Comments: Patient is a poor historian. Expressive deficits present and history of stroke per chart. Patient is able to follow single step commands consistently and is cooperative throughout session        Exercises      General Comments General comments (skin integrity, edema, etc.):  (Pt appears very frail and underweight)      Pertinent Vitals/Pain Pain Assessment Pain Assessment: No/denies pain    Home Living                          Prior Function            PT Goals (current goals can now be found in the care plan section) Acute Rehab PT Goals Patient Stated Goal: to work with physical therapy Progress towards PT goals: Progressing toward goals    Frequency    Min 1X/week      PT Plan  Co-evaluation              AM-PAC PT "6 Clicks" Mobility   Outcome Measure  Help needed turning from your back to your side while in a flat bed without using bedrails?: None Help needed moving from lying on your back to sitting on the side of a flat bed without using bedrails?: A Little Help needed moving to and from a bed to a chair (including a wheelchair)?: A Little Help needed standing up from a chair using your arms (e.g., wheelchair or bedside chair)?: A Little Help needed to walk in hospital room?: A Little Help needed climbing 3-5 steps with a railing? : A Little 6 Click Score: 19    End of Session   Activity Tolerance: Patient tolerated treatment well Patient left: in bed;with call bell/phone  within reach Nurse Communication: Mobility status PT Visit Diagnosis: Difficulty in walking, not elsewhere classified (R26.2);Other abnormalities of gait and mobility (R26.89)     Time: 7829-5621 PT Time Calculation (min) (ACUTE ONLY): 13 min  Charges:    $Gait Training: 8-22 mins PT General Charges $$ ACUTE PT VISIT: 1 Visit                    Zadie Cleverly, PTA  Jannet Askew 10/21/2022, 2:47 PM

## 2022-10-21 NOTE — ED Notes (Signed)
  Patient resting at this time.  Woke patient up to offer help ambulating to bathroom and offer food.  Patient declined both and requested a blanket to sleep.

## 2022-10-22 NOTE — TOC Progression Note (Addendum)
Transition of Care [facility]) - Progression Note    Patient Details  Name: Bonnie Schneider MRN: [id number] Date of Birth: October 21, 1955  Transition of Care [facility]) CM/SW Contact  Kreg Shropshire, RN Phone Number: 10/22/2022, 1:09 PM  Clinical Narrative:     Cm spoke with Jettie Booze [phone] who is over [employer], [place], and [place]. She stated she cannot accept pt due to previous behavior issues at previous facility.  1340- Cm spoke with Legal Guardian Rebecka Apley of [place] [phone] to get updates. She stated pt has pending medicaid with special assistance for assisted living funding. She sent updated FL2 to facilities and no responses yet.  67- Received callback from [facility]. They only have locked unit available. Cm sent FL2 to [email or web address] awaiting to hear back from [facility] Lolita Lenz [phone]. Also sent FL2 and clinical via hub. Expected Discharge Plan: Skilled Nursing Facility Barriers to Discharge: Continued Medical Work up  Expected Discharge Plan and Services       Living arrangements for the past 2 months: Skilled Nursing Facility                                       Social Determinants of Health (SDOH) Interventions SDOH Screenings   Food Insecurity: No Food Insecurity (11/15/2021)   Received from [facility], [facility]  Transportation Needs: No Transportation Needs (02/23/2021)   Received from [facility], [facility]  Social Connections: Unknown (08/06/2021)   Received from [facility], [facility]  Tobacco Use: High Risk (10/17/2022)    Readmission Risk Interventions     No data to display

## 2022-10-22 NOTE — ED Notes (Signed)
Pt given ice cream, denies other needs. Lights turned off

## 2022-10-22 NOTE — ED Notes (Addendum)
Pt laying in bed with lights out, pt denied any needs

## 2022-10-22 NOTE — ED Provider Notes (Signed)
Emergency Medicine Observation Re-evaluation Note  Bonnie Schneider is a 67 y.o. female, seen on rounds today.  Pt initially presented to the ED for complaints of Psychiatric Evaluation   Physical Exam  BP 136/75 (BP Location: Left Arm)   Pulse (!) 214   Temp 98.1 F (36.7 C) (Oral)   Resp 16   Ht 1.549 m (5\' 1" )   Wt 43.1 kg   SpO2 98%   BMI 17.95 kg/m  Physical Exam No changes or acute events overnight  ED Course / MDM   Plan  TOC is still attempting to place the patient    Jene Every, MD 10/22/22 1435

## 2022-10-22 NOTE — ED Notes (Signed)
Pt given ice cream

## 2022-10-22 NOTE — ED Notes (Signed)
Report from abby, rn

## 2022-10-22 NOTE — ED Notes (Signed)
Po fluids provided. Pt awake, denies further needs.

## 2022-10-22 NOTE — ED Notes (Signed)
Pt sleeping, resps unlabored.  

## 2022-10-22 NOTE — ED Notes (Signed)
Patient refused all morning medications.

## 2022-10-22 NOTE — ED Notes (Signed)
Pt in bathroom upon RN arrival. Toilet was continuously flushing, maintenance requested. Pt asked if she needed anything and pt told RN to get out and leave her alone.

## 2022-10-22 NOTE — ED Provider Notes (Signed)
Emergency Medicine Observation Re-evaluation Note  Bonnie Schneider is a 67 y.o. female, rightly boarding in the emergency department.  No acute events since last update.  Physical Exam  BP 136/75 (BP Location: Left Arm)   Pulse 61   Temp 98.1 F (36.7 C) (Oral)   Resp 14   Ht 5\' 1"  (1.549 m)   Wt 43.1 kg   SpO2 97%   BMI 17.95 kg/m    ED Course / MDM   No recent lab work available for review.  Plan  Current plan is for placement into an appropriate living facility once available.  Social worker is currently working with the patient to achieve this.    [name], MD 10/22/22 [phone]

## 2022-10-23 NOTE — ED Notes (Signed)
Pt sitting in bed, eating. Pt denies any needs or wants. Attempted to move call bell to where pt could reach it but pt said she would handle it and that  I could go.

## 2022-10-23 NOTE — ED Notes (Addendum)
Pt picked at breakfast, refused ensure, refused lunch, picked at supper. Asked pt if I could order anything that she would like to eat and she said no.

## 2022-10-23 NOTE — ED Provider Notes (Signed)
-----------------------------------------   6:38 AM on 10/23/2022 -----------------------------------------   Blood pressure 127/81, pulse 70, temperature 98.4 F (36.9 C), temperature source Oral, resp. rate 14, height 5\' 1"  (1.549 m), weight 43.1 kg, SpO2 96%.  The patient is calm and cooperative at this time.  There have been no acute events since the last update.  Awaiting disposition plan from Social Work team.   Irean Hong, MD 10/23/22 [phone]

## 2022-10-23 NOTE — ED Notes (Signed)
Report to ashley, rn

## 2022-10-23 NOTE — TOC Progression Note (Signed)
Transition of Care [facility]) - Progression Note    Patient Details  Name: Bonnie Schneider MRN: [id number] Date of Birth: 21-Feb-1956  Transition of Care [facility]) CM/SW Contact  Kreg Shropshire, RN Phone Number: 10/23/2022, 2:43 PM  Clinical Narrative:    Cm received call from [place] from [facility] in [place], [place]. She requested secure email sent to her with MD and Psych notes. Cm sent FL2 to [email or web address] awaiting to hear back from [facility] Lolita Lenz [phone]    Expected Discharge Plan: Skilled Nursing Facility Barriers to Discharge: Continued Medical Work up  Expected Discharge Plan and Services       Living arrangements for the past 2 months: Skilled Nursing Facility                                       Social Determinants of Health (SDOH) Interventions SDOH Screenings   Food Insecurity: No Food Insecurity (11/15/2021)   Received from [facility], [facility]  Transportation Needs: No Transportation Needs (02/23/2021)   Received from [facility], [facility]  Social Connections: Unknown (08/06/2021)   Received from [facility], [facility]  Tobacco Use: High Risk (10/17/2022)    Readmission Risk Interventions     No data to display

## 2022-10-23 NOTE — ED Notes (Signed)
Pt sleeping. Resps unlabored.  

## 2022-10-23 NOTE — ED Notes (Signed)
Pt let this tech get vitals. No other needs at this time.

## 2022-10-23 NOTE — TOC Progression Note (Signed)
Transition of Care [facility]) - Progression Note    Patient Details  Name: ZAKKIYYA REAVES MRN: [id number] Date of Birth: 1955/04/15  Transition of Care [facility]) CM/SW Contact  Darleene Cleaver, [place] Phone Number: 10/23/2022, 11:08 AM  Clinical Narrative:     CSW received an email from [place] and patient's legal guardian.  Referrals have been made to:  [facility] [address] [place], [place] [address] Fax information to [phone]   [facility] [address], [place], [place], [address] fax to [phone] and email [email or web address]   [facility] [address] [place], [place], [place], [address] [email or web address]  [facility]   [facility]    Expected Discharge Plan: Skilled Nursing Facility Barriers to Discharge: Continued Medical Work up  Expected Discharge Plan and Services       Living arrangements for the past 2 months: Skilled Nursing Facility                                       Social Determinants of Health (SDOH) Interventions SDOH Screenings   Food Insecurity: No Food Insecurity (11/15/2021)   Received from [facility], [facility]  Transportation Needs: No Transportation Needs (02/23/2021)   Received from [facility], [facility]  Social Connections: Unknown (08/06/2021)   Received from [facility], [facility]  Tobacco Use: High Risk (10/17/2022)    Readmission Risk Interventions     No data to display

## 2022-10-23 NOTE — ED Notes (Signed)
Pt resting, resps unlabored. Lights dimmed for comfort.

## 2022-10-23 NOTE — ED Notes (Signed)
Pt sleeping, resps unlabored.  

## 2022-10-24 NOTE — ED Notes (Signed)
Pt refusing lunch at this time, given chocolate ice cream and shasta cola. Pt denies further needs, WCTM.

## 2022-10-24 NOTE — ED Notes (Signed)
Pt declining PT at this time

## 2022-10-24 NOTE — Progress Notes (Signed)
PT Cancellation Note  Patient Details Name: ATASHA SPEESE MRN: [id number] DOB: 22-May-1955   Cancelled Treatment:    Reason Eval/Treat Not Completed: Other (comment) (Pt says she's cold right now- too cold to partake in PT services. Will attempt again at later date/time.)  1:37 PM, 10/24/22 Rosamaria Lints, PT, DPT Physical Therapist - [facility]  [phone] (ASCOM)     Wilgus Deyton C 10/24/2022, 1:36 PM

## 2022-10-24 NOTE — ED Provider Notes (Signed)
-----------------------------------------   6:21 AM on 10/24/2022 -----------------------------------------   Blood pressure (!) 159/82, pulse (!) 58, temperature 97.6 F (36.4 C), temperature source Oral, resp. rate 16, height 5\' 1"  (1.549 m), weight 43.1 kg, SpO2 98%.  The patient is calm and cooperative at this time.  There have been no acute events since the last update.  Awaiting disposition plan from Social Work team.   Irean Hong, MD 10/24/22 [phone]

## 2022-10-24 NOTE — ED Notes (Signed)
Patient did request a cola with ice at this time.

## 2022-10-24 NOTE — ED Notes (Signed)
Pt laying on stretcher resting. Fresh warm blankets provided to pt. Pt declining breakfast, snacks, or to order lunch. When asked if pt would like anything to eat or drink pt states "not now". Pt denies needs, WCTM.

## 2022-10-24 NOTE — TOC Progression Note (Signed)
Transition of Care [facility]) - Progression Note    Patient Details  Name: Bonnie Schneider MRN: [id number] Date of Birth: 07/29/1955  Transition of Care [facility]) CM/SW Contact  Darleene Cleaver, [place] Phone Number: 10/24/2022, 6:15 PM  Clinical Narrative:     CSW received email from patient's legal guardian Rebecka Apley.  She said, she may have a facility that can accept patient next week, she will update TOC.  TOC to continue to follow patient's progress throughout discharge planning.  Expected Discharge Plan: Skilled Nursing Facility Barriers to Discharge: Continued Medical Work up  Expected Discharge Plan and Services       Living arrangements for the past 2 months: Skilled Nursing Facility                                       Social Determinants of Health (SDOH) Interventions SDOH Screenings   Food Insecurity: No Food Insecurity (11/15/2021)   Received from [facility], [facility]  Transportation Needs: No Transportation Needs (02/23/2021)   Received from [facility], [facility]  Social Connections: Unknown (08/06/2021)   Received from [facility], [facility]  Tobacco Use: High Risk (10/17/2022)    Readmission Risk Interventions     No data to display

## 2022-10-24 NOTE — ED Notes (Addendum)
Patient refusing to answer any of majority of my questions at this time such as what her name and DOB is. However, was willing to allow me to take updated vitals.

## 2022-10-24 NOTE — ED Notes (Signed)
Pt declining vital sign check at this time

## 2022-10-25 DIAGNOSIS — R462 Strange and inexplicable behavior: Secondary | ICD-10-CM | POA: Diagnosis not present

## 2022-10-25 MED ORDER — TUBERCULIN PPD 5 UNIT/0.1ML ID SOLN
5.0000 [IU] | INTRADERMAL | Status: AC
  Administered 2022-10-25: 5 [IU] via INTRADERMAL
  Filled 2022-10-25: qty 0.1

## 2022-10-25 NOTE — ED Provider Notes (Signed)
Emergency Medicine Observation Re-evaluation Note  Bonnie Schneider is a 67 y.o. female, seen on rounds today.  Pt initially presented to the ED for complaints of Psychiatric Evaluation Currently, the patient is awaiting placement.  Physical Exam  BP (!) 150/68   Pulse 62   Temp 98.1 F (36.7 C) (Oral)   Resp 18   Ht 5\' 1"  (1.549 m)   Wt 43.1 kg   SpO2 98%   BMI 17.95 kg/m  Physical Exam General: resting comfortably  ED Course / MDM   No labs in past 24 hours  Plan  Current plan is for placement.    Phineas Semen, MD 10/25/22 [phone]

## 2022-10-25 NOTE — ED Notes (Signed)
TB test administered to right forearm; to be read on 10/27/2022 at 1258PM

## 2022-10-25 NOTE — Progress Notes (Signed)
PT Cancellation Note  Patient Details Name: Bonnie Schneider MRN: [id number] DOB: 1955/11/06   Cancelled Treatment:    Reason Eval/Treat Not Completed: Other (comment) (Chart reviewed, treatment attempted. Pt in pitch black room again, middle of afternoon. Pt declines PT again, not interested in mobility at this time. WIll continue to follow, attempt again later date/time.)   4:45 PM, 10/25/22 Rosamaria Lints, PT, DPT Physical Therapist - [facility]  [phone] (ASCOM)    , C 10/25/2022, 4:45 PM

## 2022-10-25 NOTE — TOC Progression Note (Signed)
Transition of Care [facility]) - Progression Note    Patient Details  Name: Bonnie Schneider MRN: [id number] Date of Birth: December 27, 1955  Transition of Care [facility]) CM/SW Contact  Darleene Cleaver, [place] Phone Number: 10/25/2022, 7:28 PM  Clinical Narrative:     Clinicals faxed to [facility], confirmation that fax has been received at 4:25pm today.  PPD results are pending, bedside nurse made aware to make sure PPD is ready on Sunday and results are entered in progress note.  Expected Discharge Plan: Skilled Nursing Facility Barriers to Discharge: Continued Medical Work up  Expected Discharge Plan and Services       Living arrangements for the past 2 months: Skilled Nursing Facility                                       Social Determinants of Health (SDOH) Interventions SDOH Screenings   Food Insecurity: No Food Insecurity (11/15/2021)   Received from [facility], [facility]  Transportation Needs: No Transportation Needs (02/23/2021)   Received from [facility], [facility]  Social Connections: Unknown (08/06/2021)   Received from [facility], [facility]  Tobacco Use: High Risk (10/17/2022)    Readmission Risk Interventions     No data to display

## 2022-10-25 NOTE — Progress Notes (Addendum)
Mobility Specialist - Progress Note    10/25/22 1500  Mobility  Activity Ambulated independently in hallway  Level of Assistance Independent  Assistive Device None  Distance Ambulated (ft) 180 ft  Range of Motion/Exercises Active  Activity Response Tolerated well  Mobility Referral Yes  $Mobility charge 1 Mobility  Mobility Specialist Start Time (ACUTE ONLY) 1507  Mobility Specialist Stop Time (ACUTE ONLY) 1518  Mobility Specialist Time Calculation (min) (ACUTE ONLY) 11 min   Pt sleeping in bed with head covered upon arrival on RA upon entry. Pt reluctantly agrees to ambulation. Pt STS and ambulates to hallway around NS indep with no AD. Pt gait is moderately stable. Pt endorses no pain or fatigue during session but, endorses feeling cold constantly during walk. Pt returned to bed and given two warm blankets. Pt left with needs in reach and RN notified of conclusion of session.   Johnathan Hausen Mobility Specialist 10/25/22, 3:25 PM

## 2022-10-25 NOTE — TOC Progression Note (Addendum)
Transition of Care [facility]) - Progression Note    Patient Details  Name: Bonnie Schneider MRN: [id number] Date of Birth: 06-Dec-1955  Transition of Care [facility]) CM/SW Contact  Darleene Cleaver, [place] Phone Number: 10/25/2022, 2:41 PM  Clinical Narrative:     CSW received an email from patient's legal guardian Rebecka Apley.  Per Zenaida Deed family care home is willing to accept patient next week and Judeth Cornfield will transport patient there.  CSW contacted Elly Modena, administrator, [phone] and she is requesting copy of the FL2, past few days of notes and PPD test and results.  CSW was provided the fax number which is [phone] and to send it to Oliva Bustard for review.  CSW requested PPD test, and updated FL2 to be signed by physician so information can be faxed.  CSW continuing to follow patient's progress throughout discharge planning.  Expected Discharge Plan: Skilled Nursing Facility Barriers to Discharge: Continued Medical Work up  Expected Discharge Plan and Services       Living arrangements for the past 2 months: Skilled Nursing Facility                                       Social Determinants of Health (SDOH) Interventions SDOH Screenings   Food Insecurity: No Food Insecurity (11/15/2021)   Received from [facility], [facility]  Transportation Needs: No Transportation Needs (02/23/2021)   Received from [facility], [facility]  Social Connections: Unknown (08/06/2021)   Received from [facility], [facility]  Tobacco Use: High Risk (10/17/2022)    Readmission Risk Interventions     No data to display

## 2022-10-25 NOTE — [place] FL2 (Addendum)
[place] MEDICAID FL2 LEVEL OF CARE FORM     IDENTIFICATION  Patient Name: Bonnie Schneider Birthdate: [birth year] Sex: female Admission Date (Current Location): 09/17/2022  [place] and [place] Number:  [address] [id number] [facility] Facility and Address:  [facility], [address] [place], [place] [address]      Provider Number: [id number]  Attending Physician Name and Address:  Willy Eddy, MD  Relative Name and Phone Number:  Rebecka Apley Legal Guardian from [place] DSS  [phone]    Current Level of Care: Hospital Recommended Level of Care: [facility] Prior Approval Number:    Date Approved/Denied:   PASRR Number: [id number]  Discharge Plan: Family Care Home    Current Diagnoses: Patient Active Problem List   Diagnosis Date Noted   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   Hypocortisolemia (HCC) 02/19/2016   Hypertension 10/21/2011   Chronic back pain 10/21/2011   Encounter for screening colonoscopy 10/21/2011   Bloating 10/21/2011    Orientation RESPIRATION BLADDER Height & Weight     Self, Place, Time, Situation  Normal Continent Weight: 95 lb (43.1 kg) Height:  5\' 1"  (154.9 cm)  BEHAVIORAL SYMPTOMS/MOOD NEUROLOGICAL BOWEL NUTRITION STATUS      Continent Diet (see discharge summary)  AMBULATORY STATUS COMMUNICATION OF NEEDS Skin   Supervision Verbally Normal                       Personal Care Assistance Level of Assistance  Bathing, Dressing, Feeding,  Bathing Assistance: Limited assistance Feeding assistance: Independent Dressing Assistance: Limited assistance     Functional Limitations Info  Sight, Hearing, Speech Sight Info: Adequate Hearing Info: Adequate Speech Info: Adequate    SPECIAL CARE FACTORS FREQUENCY                       Contractures Contractures Info: Not present    Additional Factors Info  Code Status, Allergies, Psychotropic Code Status Info: full Allergies Info:  NKA Psychotropic Info: amitriptyline (ELAVIL) tablet 150 mg         Current Medications (10/25/2022):  This is the current hospital active medication list Current Facility-Administered Medications  Medication Dose Route Frequency Provider Last Rate Last Admin   amitriptyline (ELAVIL) tablet 150 mg  150 mg Oral QHS Delton Prairie, MD       amLODipine (NORVASC) tablet 10 mg  10 mg Oral Daily Sharman Cheek, MD   10 mg at 09/21/22 8413   aspirin chewable tablet 81 mg  81 mg Oral Daily Delton Prairie, MD   81 mg at 09/21/22 0905   clopidogrel (PLAVIX) tablet 75 mg  75 mg Oral Daily Delton Prairie, MD   75 mg at 09/21/22 0905   protein supplement (ENSURE MAX) liquid  11 oz Oral Daily Pilar Jarvis, MD   11 oz at 10/14/22 1141   rosuvastatin (CRESTOR) tablet 40 mg  40 mg Oral QHS Delton Prairie, MD       senna-docusate (Senokot-S) tablet 2 tablet  2 tablet Oral BID Delton Prairie, MD   2 tablet at 09/21/22 2440   tuberculin injection 5 Units  5 Units Intradermal STAT Willy Eddy, MD   5 Units at 10/25/22 1258   Current Outpatient Medications  Medication Sig Dispense Refill   aspirin 81 MG chewable tablet Chew 81 mg by mouth daily.     clopidogrel (PLAVIX) 75 MG tablet Take 75 mg by mouth daily.     mirtazapine (REMERON) 7.5 MG tablet Take 7.5  mg by mouth at bedtime.     rosuvastatin (CRESTOR) 40 MG tablet Take 40 mg by mouth at bedtime.     senna-docusate (SENOKOT-S) 8.6-50 MG tablet Take 2 tablets by mouth 2 (two) times daily.     alprazolam (XANAX) 2 MG tablet Take 2 mg by mouth 4 (four) times daily. Anxiety (Patient not taking: Reported on 09/17/2022)     amitriptyline (ELAVIL) 50 MG tablet Take 150 mg by mouth at bedtime.  (Patient not taking: Reported on 09/17/2022)     enalapril (VASOTEC) 20 MG tablet TAKE 1 TABLET BY MOUTH EVERY DAY (Patient not taking: Reported on 09/17/2022) 30 tablet 0   furosemide (LASIX) 20 MG tablet Take 20 mg by mouth daily as needed for fluid.  (Patient not taking:  Reported on 09/17/2022)     hydrochlorothiazide (HYDRODIURIL) 25 MG tablet Take 25 mg by mouth daily. (Patient not taking: Reported on 09/17/2022)     HYDROcodone-acetaminophen (NORCO) 10-325 MG per tablet Take 1 tablet by mouth every 6 (six) hours as needed for pain. (Patient not taking: Reported on 09/17/2022)     Omega-3 Fatty Acids (FISH OIL) 1000 MG CAPS Take 3,000 mg by mouth daily. (Patient not taking: Reported on 09/17/2022)     pantoprazole (PROTONIX) 40 MG tablet Take 40 mg by mouth daily. (Patient not taking: Reported on 09/17/2022)     rizatriptan (MAXALT) 10 MG tablet Take 10 mg by mouth as needed. May repeat in 2 hours if needed (Patient not taking: Reported on 09/17/2022)     traZODone (DESYREL) 100 MG tablet Take 100 mg by mouth at bedtime. (Patient not taking: Reported on 09/17/2022)       Discharge Medications: Please see discharge summary for a list of discharge medications.  Relevant Imaging Results:  Relevant Lab Results:   Additional Information SSN: [id number]  Darleene Cleaver, LCSW

## 2022-10-25 NOTE — ED Notes (Signed)
Patient ambulated throughout department with mobility services

## 2022-10-26 NOTE — ED Notes (Signed)
Pt is resting in bed with her eyes closed. Respirations are even and unlabored. NAD observed.

## 2022-10-26 NOTE — ED Provider Notes (Deleted)
-----------------------------------------   7:05 AM on 10/26/2022 -----------------------------------------   Blood pressure (!) 150/68, pulse 62, temperature 98.1 F (36.7 C), temperature source Oral, resp. rate 18, height 1.549 m (5\' 1" ), weight 43.1 kg, SpO2 98%.  The patient is calm and cooperative at this time.  There have been no acute events since the last update.  Awaiting disposition plan from [facility] team.   Loleta Rose, MD 10/26/22 [phone]

## 2022-10-26 NOTE — ED Provider Notes (Signed)
-----------------------------------------   7:06 AM on 10/26/2022 -----------------------------------------   Blood pressure (!) 150/68, pulse 62, temperature 98.1 F (36.7 C), temperature source Oral, resp. rate 18, height 1.549 m (5\' 1" ), weight 43.1 kg, SpO2 98%.  The patient is calm and cooperative at this time.  There have been no acute events since the last update.  Awaiting disposition plan from [facility] team.   Loleta Rose, MD 10/26/22 [phone]

## 2022-10-26 NOTE — Progress Notes (Signed)
PT Cancellation Note  Patient Details Name: Bonnie Schneider MRN: [id number] DOB: 06/18/1955   Cancelled Treatment:    Reason Eval/Treat Not Completed: Other (comment): Pt's chart reviewed, treatment attempted. Patient visibly frustrated upon PT entrance into room due to spilling milk over clothes/bed, nursing present in room assisting patient. PT encouraging patient in participation in PT services after clothing change, patient refusing PT at this time. Will attempt again at later date/time. Will continue to follow acutely.   Creed Copper Fairly, PT, DPT 10/26/22 10:40 AM

## 2022-10-26 NOTE — ED Notes (Signed)
Pt eating dinner

## 2022-10-27 DIAGNOSIS — R462 Strange and inexplicable behavior: Secondary | ICD-10-CM | POA: Diagnosis not present

## 2022-10-27 NOTE — ED Provider Notes (Signed)
-----------------------------------------   6:13 AM on 10/27/2022 -----------------------------------------   Blood pressure (!) 146/74, pulse (!) 52, temperature 97.6 F (36.4 C), temperature source Oral, resp. rate 16, height 5\' 1"  (1.549 m), weight 43.1 kg, SpO2 99%.  The patient is calm and cooperative at this time.  There have been no acute events since the last update.  Awaiting disposition plan from Social Work team.   Irean Hong, MD 10/27/22 [phone]

## 2022-10-27 NOTE — ED Notes (Signed)
Pt set up with breakfast tray at this time.

## 2022-10-27 NOTE — ED Provider Notes (Signed)
Emergency Medicine Observation Re-evaluation Note  Bonnie Schneider is a 67 y.o. female, seen on rounds today.  Pt initially presented to the ED for complaints of Psychiatric Evaluation  Currently, the patient is resting comfortably.  Physical Exam  BP (!) 153/83   Pulse (!) 50   Temp 97.8 F (36.6 C) (Oral)   Resp 15   Ht 5\' 1"  (1.549 m)   Wt 43.1 kg   SpO2 96%   BMI 17.95 kg/m  General: No acute distress Cardiac: Well-perfused extremities Lungs: No respiratory distress Psych: Appropriate mood and affect  ED Course / MDM  EKG:   I have reviewed the labs performed to date as well as medications administered while in observation.  Recent changes in the last 24 hours include none.  Plan  Current plan is for placement.   Merwyn Katos, MD 10/27/22 [phone]

## 2022-10-27 NOTE — Progress Notes (Signed)
Physical Therapy Treatment Patient Details Name: Bonnie Schneider MRN: [id number] DOB: May 09, 1955 Today's Date: 10/27/2022   History of Present Illness Patient is a 68 year old female who initially presented to the ED for complaints of Psychiatric Evaluation from [facility]. History of hypertension, depression, COPD, previous stroke.    PT Comments  Pt requires encouragement for participation, pt perseverative on being cold throughout session, also with expressive deficits that limit communication. Pt is able to ambulate increased distances without AD with mod I with gait pattern as noted below. Pt participated in some balance tasks. Will continue to follow pt to address high level balance & reduce fall risk.    If plan is discharge home, recommend the following: Help with stairs or ramp for entrance;Assist for transportation;Assistance with cooking/housework;Direct supervision/assist for medications management;A little help with bathing/dressing/bathroom   Can travel by private vehicle     Yes  Equipment Recommendations  None recommended by PT    Recommendations for Other Services       Precautions / Restrictions Precautions Precautions: Fall Restrictions Weight Bearing Restrictions: No     Mobility  Bed Mobility Overal bed mobility: Modified Independent             General bed mobility comments: sit>supine    Transfers Overall transfer level: Independent Equipment used: None Transfers: Sit to/from Stand Sit to Stand: Independent                Ambulation/Gait Ambulation/Gait assistance: Modified independent (Device/Increase time) Gait Distance (Feet): 200 Feet Assistive device: None Gait Pattern/deviations: Decreased step length - right, Decreased step length - left, Decreased stride length, Decreased dorsiflexion - right, Decreased dorsiflexion - left Gait velocity: slightly decreased     General Gait Details: decreased heel strike RLE; pt engaged in  abrupt stops on command, head turns, no LOB noted   Stairs             Wheelchair Mobility     Tilt Bed    Modified Rankin (Stroke Patients Only)       Balance Overall balance assessment: Needs assistance Sitting-balance support: Feet supported Sitting balance-Leahy Scale: Good     Standing balance support: No upper extremity supported Standing balance-Leahy Scale: Good                              Cognition Arousal/Alertness: Awake/alert Behavior During Therapy: WFL for tasks assessed/performed Overall Cognitive Status: Difficult to assess                                 General Comments: Expressive deficits, requires encouragement for participation. Follows simple commands during session.        Exercises      General Comments General comments (skin integrity, edema, etc.): Pt performed LLE single leg stance with close supervision<>Min assist with focus on high level balance training, pt able to sustain ~5-10 seconds.      Pertinent Vitals/Pain Pain Assessment Pain Assessment: No/denies pain    Home Living                          Prior Function            PT Goals (current goals can now be found in the care plan section) Acute Rehab PT Goals Patient Stated Goal: to work with physical therapy PT Goal  Formulation: With patient Time For Goal Achievement: 10/31/22 Potential to Achieve Goals: Fair Additional Goals Additional Goal #1: Will improve Berg Balance Score to 44 to decrease risk for falls Progress towards PT goals: Progressing toward goals    Frequency    Min 1X/week      PT Plan Discharge plan needs to be updated    Co-evaluation              AM-PAC PT "6 Clicks" Mobility   Outcome Measure  Help needed turning from your back to your side while in a flat bed without using bedrails?: None Help needed moving from lying on your back to sitting on the side of a flat bed without using  bedrails?: None Help needed moving to and from a bed to a chair (including a wheelchair)?: None Help needed standing up from a chair using your arms (e.g., wheelchair or bedside chair)?: None Help needed to walk in hospital room?: None Help needed climbing 3-5 steps with a railing? : A Little 6 Click Score: 23    End of Session   Activity Tolerance: Patient tolerated treatment well Patient left: in bed;with call bell/phone within reach   PT Visit Diagnosis: Difficulty in walking, not elsewhere classified (R26.2);Other abnormalities of gait and mobility (R26.89)     Time: 4010-2725 PT Time Calculation (min) (ACUTE ONLY): 9 min  Charges:    $Therapeutic Activity: 8-22 mins PT General Charges $$ ACUTE PT VISIT: 1 Visit                     Aleda Grana, PT, DPT 10/27/22, 8:59 AM   Sandi Mariscal 10/27/2022, 8:58 AM

## 2022-10-28 NOTE — ED Notes (Signed)
Pt eating lunch. Continues to refuse lights to be turned on

## 2022-10-28 NOTE — TOC Progression Note (Addendum)
Transition of Care [facility]) - Progression Note    Patient Details  Name: Bonnie Schneider MRN: [id number] Date of Birth: July 03, 1955  Transition of Care [facility]) CM/SW Contact  Kreg Shropshire, RN Phone Number: 10/28/2022, 9:18 AM  Clinical Narrative:     Per previous notes, PPD was done on 8/2. Cm sent message to RN in regards to pt PPD results reading.  1150-Cm sent fax to Oliva Bustard of [facility] [phone] for PPD results for review.   Expected Discharge Plan: Skilled Nursing Facility Barriers to Discharge: Continued Medical Work up  Expected Discharge Plan and Services       Living arrangements for the past 2 months: Skilled Nursing Facility                                       Social Determinants of Health (SDOH) Interventions SDOH Screenings   Food Insecurity: No Food Insecurity (11/15/2021)   Received from [facility], [facility]  Transportation Needs: No Transportation Needs (02/23/2021)   Received from [facility], [facility]  Social Connections: Unknown (08/06/2021)   Received from [facility], [facility]  Tobacco Use: High Risk (10/17/2022)    Readmission Risk Interventions     No data to display

## 2022-10-28 NOTE — ED Notes (Signed)
Pt stepped out into doorway and motioned for RN to come, pt asked "where have you been? Where is Bonnie Schneider", explained I do not know who Bonnie Schneider is, and asked if pt needs anything, pt yelled "just forget it" and slammed door in RN's face

## 2022-10-28 NOTE — ED Provider Notes (Addendum)
Emergency Medicine Observation Re-evaluation Note  Bonnie Schneider is a 67 y.o. female, seen on rounds today.  Pt initially presented to the ED for complaints of Psychiatric Evaluation  Currently, the patient is is no acute distress. Denies any concerns at this time.  Physical Exam  Blood pressure 130/62, pulse 74, temperature 98 F (36.7 C), resp. rate 16, height 5\' 1"  (1.549 m), weight 43.1 kg, SpO2 97%.  Physical Exam: General: No apparent distress Psych: Resting comfortably     ED Course / MDM     I have reviewed the labs performed to date as well as medications administered while in observation.  Recent changes in the last 24 hours include: No acute events overnight.  Plan   Current plan: Patient awaiting social work disposition  Social work stated that she has a family care home that she is going to be discharged to tomorrow    Corena Herter, MD 10/28/22 1057    Corena Herter, MD 10/28/22 1317

## 2022-10-28 NOTE — ED Notes (Signed)
Pt given breakfast tray, sitting in dark. Pt refuses lights to be turned on to see to eat.

## 2022-10-28 NOTE — TOC Progression Note (Addendum)
Transition of Care [facility]) - Progression Note    Patient Details  Name: Bonnie Schneider MRN: [id number] Date of Birth: [birth year]  Transition of Care [facility]) CM/SW Contact  Darleene Cleaver, [place] Phone Number: 10/28/2022, 1:58 PM  Clinical Narrative:     CSW spoke to Elly Modena, Production designer, theatre/television/film at [facility], [phone], she can accept patient tomorrow.  She requested that medication are sent to [facility] and [facility], [address] [place], [place] [address], Phone [phone], Fax [phone].  She also requested if patient can be ordered a PRN to help in case she gets anxious.  CSW asked attending physician and bedside nurse.  Per Rebecka Apley, [phone] patient's legal guardian, she will be here to pick patient up around 11am on Tuesday 10/29/2022.  CSW to continue to follow patient's progress throughout discharge planning.   Expected Discharge Plan: Skilled Nursing Facility Barriers to Discharge: Continued Medical Work up  Expected Discharge Plan and Services       Living arrangements for the past 2 months: Skilled Nursing Facility                                       Social Determinants of Health (SDOH) Interventions SDOH Screenings   Food Insecurity: No Food Insecurity (11/15/2021)   Received from [facility], [facility]  Transportation Needs: No Transportation Needs (02/23/2021)   Received from [facility], [facility]  Social Connections: Unknown (08/06/2021)   Received from [facility], [facility]  Tobacco Use: High Risk (10/17/2022)    Readmission Risk Interventions     No data to display

## 2022-10-28 NOTE — ED Notes (Addendum)
As noted in flowsheets from 8/4 at 1200, pt has no induration at injection site, PPD negative.

## 2022-10-29 MED ORDER — ASPIRIN 81 MG PO TBEC: 81.0000 mg | DELAYED_RELEASE_TABLET | Freq: Every day | ORAL | 2 refills | Status: DC

## 2022-10-29 MED ORDER — AMITRIPTYLINE HCL 150 MG PO TABS: 150.0000 mg | ORAL_TABLET | Freq: Every day | ORAL | 2 refills | Status: DC

## 2022-10-29 MED ORDER — CLOPIDOGREL BISULFATE 75 MG PO TABS: 75.0000 mg | ORAL_TABLET | Freq: Every day | ORAL | 2 refills | Status: DC

## 2022-10-29 MED ORDER — SENNOSIDES-DOCUSATE SODIUM 8.6-50 MG PO TABS: 2.0000 | ORAL_TABLET | Freq: Two times a day (BID) | ORAL | 2 refills | Status: DC

## 2022-10-29 MED ORDER — ROSUVASTATIN CALCIUM 40 MG PO TABS: 40.0000 mg | ORAL_TABLET | Freq: Every day | ORAL | 2 refills | Status: DC

## 2022-10-29 MED ORDER — AMLODIPINE BESYLATE 10 MG PO TABS: 10.0000 mg | ORAL_TABLET | Freq: Every day | ORAL | 2 refills | Status: DC

## 2022-10-29 NOTE — Discharge Instructions (Addendum)
Please take all medications as prescribed. Follow up with your PCP as needed.

## 2022-10-29 NOTE — ED Provider Notes (Signed)
-----------------------------------------   10:07 AM on 10/29/2022 -----------------------------------------   Blood pressure 130/62, pulse 74, temperature 98 F (36.7 C), resp. rate 16, height 5\' 1"  (1.549 m), weight 43.1 kg, SpO2 97%.  The patient is calm and cooperative at this time.  There have been no acute events since the last update.   Patient will be transferred to new group home with guardian today. Home meds prescribed and discussed return precautions with patient.   Janith Lima, MD 10/29/22 [phone]

## 2022-10-29 NOTE — TOC Transition Note (Addendum)
Transition of Care [facility]) - CM/SW Discharge Note   Patient Details  Name: Bonnie Schneider MRN: [id number] Date of Birth: 08/24/1955  Transition of Care [facility]) CM/SW Contact:  Darleene Cleaver, LCSW Phone Number: 10/29/2022, 9:50 AM   Clinical Narrative:     CSW received phone call from Rebecka Apley, the patient's legal guardian that she is on her way to the hospital to pick patient up.  CSW updated bedside nurse and attending physician.  [facility] is requesting medications to be sent to [facility], [address] [place], [place] [address], Phone [phone], Fax [phone].  Per attending physician, patient does not always take oral meds and she has not needed a PRN while she has been here.  Per physician he is not going to prescribe a prn medication for her, but will send the home prescriptions to the pharmacy.  TOC signing off, please reconsult if other TOC needs arise.  Final next level of care: Rest Home Barriers to Discharge: Barriers Resolved   Patient Goals and CMS Choice CMS Medicare.gov Compare Post Acute Care list provided to:: Legal Guardian Choice offered to / list presented to : [facility] POA / Guardian  Discharge Placement                Patient chooses bed at: [facility] Patient to be transferred to facility by: Patient's legal guardian Rebecka Apley Name of family member notified: Legal Guardian Rebecka Apley Patient and family notified of of transfer: 10/29/22  Discharge Plan and Services Additional resources added to the After Visit Summary for                                       Social Determinants of Health (SDOH) Interventions SDOH Screenings   Food Insecurity: No Food Insecurity (11/15/2021)   Received from [facility], [facility]  Transportation Needs: No Transportation Needs (02/23/2021)   Received from [facility], [facility]  Social Connections: Unknown (08/06/2021)    Received from [facility], [facility]  Tobacco Use: High Risk (10/17/2022)     Readmission Risk Interventions     No data to display

## 2022-10-29 NOTE — ED Notes (Signed)
Pt belongings returned to legal guardian prior to DC.

## 2022-10-29 NOTE — ED Provider Notes (Signed)
Emergency Medicine Observation Re-evaluation Note  Bonnie Schneider is a 67 y.o. female, seen on rounds today.  Pt initially presented to the ED for complaints of Psychiatric Evaluation  Currently, the patient is resting comfortably.  Physical Exam  BP 130/62   Pulse 74   Temp 98 F (36.7 C)   Resp 16   Ht 5\' 1"  (1.549 m)   Wt 43.1 kg   SpO2 97%   BMI 17.95 kg/m  General: No acute distress Cardiac: Well-perfused extremities Lungs: No respiratory distress Psych: Appropriate mood and affect  ED Course / MDM  EKG:   I have reviewed the labs performed to date as well as medications administered while in observation.  Recent changes in the last 24 hours include none.  Plan  Current plan is for placement.   Merwyn Katos, MD 10/29/22 [phone]

## 2022-10-29 NOTE — ED Notes (Signed)
Checked in on pt multiple times in the last 6.5 hours - denies wants/needs and has been sleeping.

## 2022-11-21 ENCOUNTER — Ambulatory Visit (INDEPENDENT_AMBULATORY_CARE_PROVIDER_SITE_OTHER): Payer: 59 | Admitting: Internal Medicine

## 2022-11-21 ENCOUNTER — Encounter: Payer: Self-pay | Admitting: Internal Medicine

## 2022-11-21 VITALS — BP 127/69 | HR 93 | Resp 16 | Ht 61.0 in | Wt 94.6 lb

## 2022-11-21 DIAGNOSIS — Z8673 Personal history of transient ischemic attack (TIA), and cerebral infarction without residual deficits: Secondary | ICD-10-CM

## 2022-11-21 DIAGNOSIS — R4701 Aphasia: Secondary | ICD-10-CM | POA: Diagnosis not present

## 2022-11-21 DIAGNOSIS — I6522 Occlusion and stenosis of left carotid artery: Secondary | ICD-10-CM | POA: Diagnosis not present

## 2022-11-21 DIAGNOSIS — Z23 Encounter for immunization: Secondary | ICD-10-CM

## 2022-11-21 DIAGNOSIS — F333 Major depressive disorder, recurrent, severe with psychotic symptoms: Secondary | ICD-10-CM

## 2022-11-21 DIAGNOSIS — R739 Hyperglycemia, unspecified: Secondary | ICD-10-CM

## 2022-11-21 DIAGNOSIS — Z1159 Encounter for screening for other viral diseases: Secondary | ICD-10-CM | POA: Diagnosis not present

## 2022-11-21 DIAGNOSIS — I10 Essential (primary) hypertension: Secondary | ICD-10-CM

## 2022-11-21 NOTE — Patient Instructions (Signed)
Please continue giving medications as prescribed.  Please continue to follow low carb diet and ambulate as tolerated.

## 2022-11-21 NOTE — Assessment & Plan Note (Signed)
BP Readings from Last 1 Encounters:  11/21/22 127/69   Well-controlled with amlodipine 10 mg QD Counseled for compliance with the medications Advised DASH diet

## 2022-11-21 NOTE — Assessment & Plan Note (Signed)
Due to CVA It is contributing to her agitation mostly -although has underlying MDD as well Would benefit from speech therapy up to certain extent-home health referral provided for speech therapy

## 2022-11-21 NOTE — Progress Notes (Signed)
New Patient Office Visit  Subjective:  Patient ID: Bonnie Schneider, female    DOB: 1956-03-22  Age: 67 y.o. MRN: [id number]  CC:  Chief Complaint  Patient presents with   Establish Care    HPI Bonnie Schneider is a 67 y.o. female with past medical history of HTN, CVA with residual expressive aphasia, HLD, MDD and dementia who presents for establishing care.  Her social worker is present during the visit today and has helped with the history.  She currently lives in [facility] adult group home.  Her sister was called for assisting with history prior to her CVA in 11/22.  She had acute encephalopathy, right-sided weakness, aphasia, and other subtle strokelike symptoms in 11/22. During the process of the work-up she underwent a CTA of the head and neck that demonstrated a significant amount of left internal carotid artery disease. In addition, CT also showed multiple infarcts.  She had extensive stay at hospital and SAR for these concerns.  She was later referred to neurology for outpatient care.  She was placed on aspirin, Plavix and statin.  She was also referred to interventional neurology for left internal carotid artery disease with [facility]. She had a carotid ultrasound on 12/18/22, which showed only a moderate stenosis of the distal common carotid artery. The adherent thrombus noted on the CT scan had resolved. She was advised to continue DAPT for 1 year by IR.  She currently is A&O X 1, has expressive aphasia and gets agitated very easily due to it. She is able to write, but has limited skills left.  According to her sister, she had history of uncontrolled HTN before CVA, and was noncompliant to her treatment.  She is currently on amlodipine 10 mg QD. Her BP is wnl.  She has longstanding history of MDD.  She has episodes of agitation currently.  She takes amitriptyline 150 mg nightly.  She recently had ER visit, followed by inpatient psychiatric admission for agitation.  Her social  worker reports that she has very frequent agitation spells while conversing with her caring facility at the adult group home.  Past Medical History:  Diagnosis Date   Chronic back pain    COPD (chronic obstructive pulmonary disease) (HCC)    Depression    GERD (gastroesophageal reflux disease)    Hypertension    Immature cataract    Migraine    last migraine 11/07/11   Shortness of breath    with exertion   Vascular dementia [facility])     Past Surgical History:  Procedure Laterality Date   CATARACT EXTRACTION W/PHACO Right 01/18/2013   Procedure: CATARACT EXTRACTION PHACO AND INTRAOCULAR LENS PLACEMENT (IOC);  Surgeon: Gemma Payor, MD;  Location: [facility] ORS;  Service: Ophthalmology;  Laterality: Right;  CDE:12.85   CATARACT EXTRACTION W/PHACO Left 01/28/2013   Procedure: CATARACT EXTRACTION PHACO AND INTRAOCULAR LENS PLACEMENT (IOC);  Surgeon: Gemma Payor, MD;  Location: [facility] ORS;  Service: Ophthalmology;  Laterality: Left;  CDE:7.93   CHOLECYSTECTOMY     COLONOSCOPY  11/08/2011   Procedure: COLONOSCOPY;  Surgeon: Malissa Hippo, MD;  Location: [facility] ENDO SUITE;  Service: Endoscopy;  Laterality: N/A;  12:15    History reviewed. No pertinent family history.  Social History   Socioeconomic History   Marital status: Divorced    Spouse name: Not on file   Number of children: Not on file   Years of education: Not on file   Highest education level: Not on file  Occupational  History   Not on file  Tobacco Use   Smoking status: Former    Current packs/day: 1.00    Average packs/day: 1 pack/day for 40.0 years (40.0 ttl pk-yrs)    Types: Cigarettes   Smokeless tobacco: Current   Tobacco comments:    1 pack a day  Substance and Sexual Activity   Alcohol use: No   Drug use: No   Sexual activity: Not on file  Other Topics Concern   Not on file  Social History Narrative   Not on file   Social Determinants of Health   Financial Resource Strain: Not on file  Food Insecurity: No Food  Insecurity (11/15/2021)   Received from [facility], [facility]   Hunger Vital Sign    Worried About Running Out of Food in the Last Year: Never true    Ran Out of Food in the Last Year: Never true  Transportation Needs: No Transportation Needs (02/23/2021)   Received from [facility], [facility]   PRAPARE - Transportation    Lack of Transportation (Medical): No    Lack of Transportation (Non-Medical): No  Physical Activity: Not on file  Stress: Not on file  Social Connections: Unknown (08/06/2021)   Received from [facility], [facility]   Social Network    Social Network: Not on file  Intimate Partner Violence: Unknown (06/28/2021)   Received from [facility], [facility]   HITS    Physically Hurt: Not on file    Insult or Talk Down To: Not on file    Threaten Physical Harm: Not on file    Scream or Curse: Not on file    ROS Review of Systems  Constitutional:  Negative for chills and fever.  HENT:  Negative for congestion, sinus pressure, sinus pain and sore throat.   Eyes:  Negative for pain and discharge.  Respiratory:  Negative for cough and shortness of breath.   Cardiovascular:  Negative for chest pain and palpitations.  Gastrointestinal:  Negative for abdominal pain, nausea and vomiting.  Endocrine: Negative for polydipsia and polyuria.  Genitourinary:  Negative for dysuria and hematuria.  Musculoskeletal:  Negative for neck pain and neck stiffness.  Skin:  Negative for rash.  Neurological:  Positive for speech difficulty and weakness. Negative for dizziness.  Psychiatric/Behavioral:  Positive for agitation, confusion and decreased concentration.     Objective:   Today's Vitals: BP 127/69   Pulse 93   Resp 16   Ht 5\' 1"  (1.549 m)   Wt 94 lb 9.6 oz (42.9 kg)   SpO2 96%   BMI 17.87 kg/m   Physical Exam Vitals reviewed.  Constitutional:      General: She is not in acute distress.    Appearance: She is not diaphoretic.  HENT:     Head:  Normocephalic and atraumatic.     Nose: Nose normal.     Mouth/Throat:     Mouth: Mucous membranes are moist.     Comments: Absent teeth Eyes:     General: No scleral icterus.    Extraocular Movements: Extraocular movements intact.  Cardiovascular:     Rate and Rhythm: Normal rate and regular rhythm.     Heart sounds: Normal heart sounds. No murmur heard. Pulmonary:     Breath sounds: Normal breath sounds. No wheezing or rales.  Musculoskeletal:     Cervical back: Neck supple. No tenderness.     Right lower leg: No edema.     Left lower leg: No  edema.  Skin:    General: Skin is warm.     Findings: No rash.  Neurological:     General: No focal deficit present.     Mental Status: She is alert.     Comments: Alert and oriented to person only  Psychiatric:        Attention and Perception: She is inattentive.        Behavior: Behavior is cooperative.        Cognition and Memory: Cognition is impaired.     Assessment & Plan:   Problem List Items Addressed This Visit       Cardiovascular and Mediastinum   Essential hypertension - Primary    BP Readings from Last 1 Encounters:  11/21/22 127/69   Well-controlled with amlodipine 10 mg QD Counseled for compliance with the medications Advised DASH diet      Relevant Orders   CBC with Differential/Platelet   CMP14+EGFR   Stenosis of left carotid artery    Likely contributing to her CVA Had interventional radiology evaluation - advised to take DAPT till 09/24, plan to stop Plavix after it Continue statin        Other   History of CVA (cerebrovascular accident)    Has residual expressive aphasia from it On aspirin, Plavix and statin Had agitation due to expressive aphasia On amitriptyline for insomnia and MDD      Relevant Orders   Ambulatory referral to Home Health   Lipid Profile   Expressive aphasia    Due to CVA It is contributing to her agitation mostly -although has underlying MDD as well Would benefit  from speech therapy up to certain extent-home health referral provided for speech therapy      Relevant Orders   Ambulatory referral to Home Health   Severe episode of recurrent major depressive disorder, with psychotic features (HCC)    She has longstanding history of MDD Has episodes of agitation at times Continue amitriptyline 150 mg QD Referred to psychiatry      Relevant Orders   Ambulatory referral to Psychiatry   Other Visit Diagnoses     Need for hepatitis C screening test       Relevant Orders   Hepatitis C Antibody   Hyperglycemia       Relevant Orders   CMP14+EGFR   Hemoglobin A1c   Encounter for immunization       Relevant Orders   Flu Vaccine Trivalent High Dose (Fluad) (Completed)       Outpatient Encounter Medications as of 11/21/2022  Medication Sig   amitriptyline (ELAVIL) 150 MG tablet Take 1 tablet (150 mg total) by mouth at bedtime.   amLODipine (NORVASC) 10 MG tablet Take 1 tablet (10 mg total) by mouth daily.   aspirin EC 81 MG tablet Take 1 tablet (81 mg total) by mouth daily. Swallow whole.   clopidogrel (PLAVIX) 75 MG tablet Take 1 tablet (75 mg total) by mouth daily.   rosuvastatin (CRESTOR) 40 MG tablet Take 1 tablet (40 mg total) by mouth daily.   senna-docusate (SENOKOT-S) 8.6-50 MG tablet Take 2 tablets by mouth 2 (two) times daily.   No facility-administered encounter medications on file as of 11/21/2022.    Follow-up: Return in about 3 months (around 02/21/2023) for HTN and CVA.   Anabel Halon, MD

## 2022-11-21 NOTE — Assessment & Plan Note (Addendum)
Likely contributing to her CVA Had interventional radiology evaluation - advised to take DAPT till 09/24, plan to stop Plavix after it Continue statin

## 2022-11-21 NOTE — Assessment & Plan Note (Signed)
Has residual expressive aphasia from it On aspirin, Plavix and statin Had agitation due to expressive aphasia On amitriptyline for insomnia and MDD

## 2022-11-21 NOTE — Assessment & Plan Note (Signed)
She has longstanding history of MDD Has episodes of agitation at times Continue amitriptyline 150 mg QD Referred to psychiatry

## 2022-11-22 ENCOUNTER — Other Ambulatory Visit: Payer: Self-pay | Admitting: Internal Medicine

## 2022-11-22 ENCOUNTER — Encounter: Payer: Self-pay | Admitting: Internal Medicine

## 2022-11-22 DIAGNOSIS — D509 Iron deficiency anemia, unspecified: Secondary | ICD-10-CM

## 2022-11-22 DIAGNOSIS — K219 Gastro-esophageal reflux disease without esophagitis: Secondary | ICD-10-CM

## 2022-11-22 DIAGNOSIS — K922 Gastrointestinal hemorrhage, unspecified: Secondary | ICD-10-CM

## 2022-11-22 LAB — CMP14+EGFR
ALT: 40 IU/L — ABNORMAL HIGH (ref 0–32)
AST: 23 IU/L (ref 0–40)
Albumin: 4.2 g/dL (ref 3.9–4.9)
Alkaline Phosphatase: 112 IU/L (ref 44–121)
BUN/Creatinine Ratio: 21 (ref 12–28)
BUN: 12 mg/dL (ref 8–27)
Bilirubin Total: 0.7 mg/dL (ref 0.0–1.2)
CO2: 25 mmol/L (ref 20–29)
Calcium: 9.3 mg/dL (ref 8.7–10.3)
Chloride: 104 mmol/L (ref 96–106)
Creatinine, Ser: 0.56 mg/dL — ABNORMAL LOW (ref 0.57–1.00)
Globulin, Total: 2.4 g/dL (ref 1.5–4.5)
Glucose: 110 mg/dL — ABNORMAL HIGH (ref 70–99)
Potassium: 3.4 mmol/L — ABNORMAL LOW (ref 3.5–5.2)
Sodium: 145 mmol/L — ABNORMAL HIGH (ref 134–144)
Total Protein: 6.6 g/dL (ref 6.0–8.5)
eGFR: 101 mL/min/{1.73_m2} (ref >59)

## 2022-11-22 LAB — HEPATITIS C ANTIBODY: Hep C Virus Ab: NONREACTIVE

## 2022-11-22 LAB — CBC WITH DIFFERENTIAL/PLATELET
Basophils Absolute: 0 10*3/uL (ref 0.0–0.2)
Basos: 0 %
EOS (ABSOLUTE): 0.1 10*3/uL (ref 0.0–0.4)
Eos: 1 %
Hematocrit: 32.6 % — ABNORMAL LOW (ref 34.0–46.6)
Hemoglobin: 10.9 g/dL — ABNORMAL LOW (ref 11.1–15.9)
Immature Grans (Abs): 0 10*3/uL (ref 0.0–0.1)
Immature Granulocytes: 0 %
Lymphocytes Absolute: 1.8 10*3/uL (ref 0.7–3.1)
Lymphs: 25 %
MCH: 29 pg (ref 26.6–33.0)
MCHC: 33.4 g/dL (ref 31.5–35.7)
MCV: 87 fL (ref 79–97)
Monocytes Absolute: 0.6 10*3/uL (ref 0.1–0.9)
Monocytes: 8 %
Neutrophils Absolute: 4.8 10*3/uL (ref 1.4–7.0)
Neutrophils: 66 %
Platelets: 364 10*3/uL (ref 150–450)
RBC: 3.76 x10E6/uL — ABNORMAL LOW (ref 3.77–5.28)
RDW: 13.6 % (ref 11.7–15.4)
WBC: 7.3 10*3/uL (ref 3.4–10.8)

## 2022-11-22 LAB — LIPID PANEL
Chol/HDL Ratio: 2 ratio (ref 0.0–4.4)
Cholesterol, Total: 91 mg/dL — ABNORMAL LOW (ref 100–199)
HDL: 46 mg/dL (ref >39)
LDL Chol Calc (NIH): 29 mg/dL (ref 0–99)
Triglycerides: 79 mg/dL (ref 0–149)
VLDL Cholesterol Cal: 16 mg/dL (ref 5–40)

## 2022-11-22 LAB — HEMOGLOBIN A1C
Est. average glucose Bld gHb Est-mCnc: 114 mg/dL
Hgb A1c MFr Bld: 5.6 % (ref 4.8–5.6)

## 2022-11-22 MED ORDER — PANTOPRAZOLE SODIUM 40 MG PO TBEC
40.0000 mg | DELAYED_RELEASE_TABLET | Freq: Every day | ORAL | 3 refills | Status: DC
Start: 2022-11-22 — End: 2023-07-21

## 2022-11-22 NOTE — Telephone Encounter (Signed)
[facility] called in on patient behalf in regard to previous mychart message  Needs stop / start  orders on patient medication    Needs call back

## 2022-11-27 NOTE — Telephone Encounter (Signed)
Returned call to [facility] and per Okey Regal I needed to speak with Rebecka Apley at dept of [facility]. Reached out to Alvin and left VM. Stephanies number is [phone] ext [phone].

## 2022-11-27 NOTE — Telephone Encounter (Signed)
Left voicemail for stephanie

## 2022-11-27 NOTE — Telephone Encounter (Signed)
Rebecka Apley returning call at the department of [facility] [phone] ext [phone].

## 2022-11-28 ENCOUNTER — Other Ambulatory Visit: Payer: Self-pay | Admitting: Internal Medicine

## 2022-11-28 ENCOUNTER — Telehealth: Payer: Self-pay | Admitting: Internal Medicine

## 2022-11-28 DIAGNOSIS — D509 Iron deficiency anemia, unspecified: Secondary | ICD-10-CM

## 2022-11-28 MED ORDER — IRON (FERROUS SULFATE) 325 (65 FE) MG PO TABS: 325.0000 mg | ORAL_TABLET | Freq: Every day | ORAL | 5 refills | Status: DC

## 2022-11-28 NOTE — Telephone Encounter (Signed)
Just an fyi

## 2022-11-28 NOTE — Telephone Encounter (Signed)
Diannia Ruder from [facility] calling saya pt only needing speech therapy- pt scheduled for Friday. Any questions call [phone] Thank you

## 2022-11-29 NOTE — Telephone Encounter (Signed)
[facility] needing orders recent to stop Plavix and start Iron and Protonix but they just need it to be signed before they can fill it

## 2022-12-02 NOTE — Telephone Encounter (Signed)
Faxed to [facility].

## 2022-12-04 DIAGNOSIS — D72829 Elevated white blood cell count, unspecified: Secondary | ICD-10-CM | POA: Diagnosis not present

## 2022-12-04 DIAGNOSIS — I10 Essential (primary) hypertension: Secondary | ICD-10-CM | POA: Diagnosis not present

## 2022-12-04 DIAGNOSIS — W19XXXA Unspecified fall, initial encounter: Secondary | ICD-10-CM | POA: Diagnosis not present

## 2022-12-04 DIAGNOSIS — W1839XA Other fall on same level, initial encounter: Secondary | ICD-10-CM | POA: Diagnosis not present

## 2022-12-04 DIAGNOSIS — Z743 Need for continuous supervision: Secondary | ICD-10-CM | POA: Diagnosis not present

## 2022-12-04 DIAGNOSIS — I6529 Occlusion and stenosis of unspecified carotid artery: Secondary | ICD-10-CM | POA: Diagnosis not present

## 2022-12-04 DIAGNOSIS — S0181XA Laceration without foreign body of other part of head, initial encounter: Secondary | ICD-10-CM | POA: Diagnosis not present

## 2022-12-04 DIAGNOSIS — Z87891 Personal history of nicotine dependence: Secondary | ICD-10-CM | POA: Diagnosis not present

## 2022-12-04 DIAGNOSIS — S0990XA Unspecified injury of head, initial encounter: Secondary | ICD-10-CM | POA: Diagnosis not present

## 2022-12-04 DIAGNOSIS — R918 Other nonspecific abnormal finding of lung field: Secondary | ICD-10-CM | POA: Diagnosis not present

## 2022-12-04 DIAGNOSIS — S01122A Laceration with foreign body of left eyelid and periocular area, initial encounter: Secondary | ICD-10-CM | POA: Diagnosis not present

## 2022-12-04 DIAGNOSIS — R55 Syncope and collapse: Secondary | ICD-10-CM | POA: Diagnosis not present

## 2022-12-05 ENCOUNTER — Telehealth: Payer: Self-pay | Admitting: Internal Medicine

## 2022-12-05 NOTE — Telephone Encounter (Signed)
Per nurse, nurse spoke to patient care giver at [facility] asked front desk to schedule an in office visit. Scheduled.

## 2022-12-05 NOTE — Telephone Encounter (Signed)
[facility] called patient fell yesterday speak to nurse, transfer to call to the nurse.

## 2022-12-06 NOTE — Telephone Encounter (Signed)
[facility] pharmacy calling says they received a fax on patient but they do not have her in their system.

## 2022-12-11 ENCOUNTER — Telehealth: Payer: Self-pay | Admitting: Internal Medicine

## 2022-12-11 DIAGNOSIS — I10 Essential (primary) hypertension: Secondary | ICD-10-CM | POA: Diagnosis not present

## 2022-12-11 DIAGNOSIS — J449 Chronic obstructive pulmonary disease, unspecified: Secondary | ICD-10-CM | POA: Diagnosis not present

## 2022-12-11 DIAGNOSIS — E785 Hyperlipidemia, unspecified: Secondary | ICD-10-CM | POA: Diagnosis not present

## 2022-12-11 DIAGNOSIS — G8929 Other chronic pain: Secondary | ICD-10-CM | POA: Diagnosis not present

## 2022-12-11 DIAGNOSIS — Z7982 Long term (current) use of aspirin: Secondary | ICD-10-CM | POA: Diagnosis not present

## 2022-12-11 DIAGNOSIS — G43909 Migraine, unspecified, not intractable, without status migrainosus: Secondary | ICD-10-CM | POA: Diagnosis not present

## 2022-12-11 DIAGNOSIS — S01112D Laceration without foreign body of left eyelid and periocular area, subsequent encounter: Secondary | ICD-10-CM | POA: Diagnosis not present

## 2022-12-11 DIAGNOSIS — Z9181 History of falling: Secondary | ICD-10-CM | POA: Diagnosis not present

## 2022-12-11 DIAGNOSIS — K219 Gastro-esophageal reflux disease without esophagitis: Secondary | ICD-10-CM | POA: Diagnosis not present

## 2022-12-11 DIAGNOSIS — R739 Hyperglycemia, unspecified: Secondary | ICD-10-CM | POA: Diagnosis not present

## 2022-12-11 DIAGNOSIS — Z87891 Personal history of nicotine dependence: Secondary | ICD-10-CM | POA: Diagnosis not present

## 2022-12-11 DIAGNOSIS — I69320 Aphasia following cerebral infarction: Secondary | ICD-10-CM | POA: Diagnosis not present

## 2022-12-11 NOTE — Telephone Encounter (Signed)
Albin Doctor, general practice w. [facility] called in on patient behalf   Verbal orders for speech therapy  1 x week for 9 weeks   Physical therapy evaluation   Call back # [phone] Can leave voicemail

## 2022-12-11 NOTE — Telephone Encounter (Signed)
Verbal orders provided to [facility]

## 2022-12-16 DIAGNOSIS — I499 Cardiac arrhythmia, unspecified: Secondary | ICD-10-CM | POA: Diagnosis not present

## 2022-12-16 DIAGNOSIS — T82856A Stenosis of peripheral vascular stent, initial encounter: Secondary | ICD-10-CM | POA: Diagnosis not present

## 2022-12-16 DIAGNOSIS — I6522 Occlusion and stenosis of left carotid artery: Secondary | ICD-10-CM | POA: Diagnosis not present

## 2022-12-18 ENCOUNTER — Ambulatory Visit: Payer: 59 | Admitting: Internal Medicine

## 2022-12-18 ENCOUNTER — Encounter: Payer: Self-pay | Admitting: Internal Medicine

## 2022-12-18 VITALS — BP 138/73 | HR 73 | Ht 61.0 in | Wt 93.6 lb

## 2022-12-18 DIAGNOSIS — G8929 Other chronic pain: Secondary | ICD-10-CM | POA: Diagnosis not present

## 2022-12-18 DIAGNOSIS — G43909 Migraine, unspecified, not intractable, without status migrainosus: Secondary | ICD-10-CM | POA: Diagnosis not present

## 2022-12-18 DIAGNOSIS — Z8673 Personal history of transient ischemic attack (TIA), and cerebral infarction without residual deficits: Secondary | ICD-10-CM | POA: Diagnosis not present

## 2022-12-18 DIAGNOSIS — E785 Hyperlipidemia, unspecified: Secondary | ICD-10-CM | POA: Diagnosis not present

## 2022-12-18 DIAGNOSIS — R739 Hyperglycemia, unspecified: Secondary | ICD-10-CM | POA: Diagnosis not present

## 2022-12-18 DIAGNOSIS — D500 Iron deficiency anemia secondary to blood loss (chronic): Secondary | ICD-10-CM | POA: Diagnosis not present

## 2022-12-18 DIAGNOSIS — Z09 Encounter for follow-up examination after completed treatment for conditions other than malignant neoplasm: Secondary | ICD-10-CM | POA: Insufficient documentation

## 2022-12-18 DIAGNOSIS — I10 Essential (primary) hypertension: Secondary | ICD-10-CM | POA: Diagnosis not present

## 2022-12-18 DIAGNOSIS — W19XXXA Unspecified fall, initial encounter: Secondary | ICD-10-CM | POA: Insufficient documentation

## 2022-12-18 DIAGNOSIS — Z9181 History of falling: Secondary | ICD-10-CM | POA: Diagnosis not present

## 2022-12-18 DIAGNOSIS — J449 Chronic obstructive pulmonary disease, unspecified: Secondary | ICD-10-CM | POA: Diagnosis not present

## 2022-12-18 DIAGNOSIS — Z87891 Personal history of nicotine dependence: Secondary | ICD-10-CM | POA: Diagnosis not present

## 2022-12-18 DIAGNOSIS — W19XXXD Unspecified fall, subsequent encounter: Secondary | ICD-10-CM

## 2022-12-18 DIAGNOSIS — Z7982 Long term (current) use of aspirin: Secondary | ICD-10-CM | POA: Diagnosis not present

## 2022-12-18 DIAGNOSIS — K219 Gastro-esophageal reflux disease without esophagitis: Secondary | ICD-10-CM | POA: Diagnosis not present

## 2022-12-18 DIAGNOSIS — S01112D Laceration without foreign body of left eyelid and periocular area, subsequent encounter: Secondary | ICD-10-CM | POA: Diagnosis not present

## 2022-12-18 DIAGNOSIS — I69320 Aphasia following cerebral infarction: Secondary | ICD-10-CM | POA: Diagnosis not present

## 2022-12-18 NOTE — Progress Notes (Signed)
Established Patient Office Visit  Subjective:  Patient ID: Bonnie Schneider, female    DOB: [birth year]  Age: 67 y.o. MRN: [id number]  CC:  Chief Complaint  Patient presents with   Follow-up    ER follow up, needing stitches removed    HPI Bonnie Schneider is a 67 y.o. female with past medical history of HTN, CVA with residual expressive aphasia, HLD, MDD and dementia who presents for f/u of recent ER visit.  She presented to the emergency department via EMS on 12/04/22 complaining of a laceration at the corner of her left eyebrow after falling earlier while transferring from a wheelchair. She got her foot twisted while transferring from her wheelchair at group home where she resides and fell. She hit the corner of her forehead and had a small laceration. Denied any other complaints. Denies any LOC, vomiting or seizures. She had CT head and cervical spine, which were negative for any acute injury. She had 4 stitches placed, which were removed today without any complication.  Of note, her CBC showed improvement in Hb from 10.9 to 12.8. She was advised to stop taking Plavix in the last visit. She has also been placed on Pantoprazole for concern for occult GI bleeding/PUD. She denies any melena or hematochezia. She still takes Aspirin and statin for h/o CVA.    Past Medical History:  Diagnosis Date   Chronic back pain    COPD (chronic obstructive pulmonary disease) (HCC)    Depression    GERD (gastroesophageal reflux disease)    Hypertension    Immature cataract    Migraine    last migraine 11/07/11   Shortness of breath    with exertion   Vascular dementia [facility])     Past Surgical History:  Procedure Laterality Date   CATARACT EXTRACTION W/PHACO Right 01/18/2013   Procedure: CATARACT EXTRACTION PHACO AND INTRAOCULAR LENS PLACEMENT (IOC);  Surgeon: Gemma Payor, MD;  Location: [facility];  Service: Ophthalmology;  Laterality: Right;  CDE:12.85   CATARACT EXTRACTION W/PHACO Left  01/28/2013   Procedure: CATARACT EXTRACTION PHACO AND INTRAOCULAR LENS PLACEMENT (IOC);  Surgeon: Gemma Payor, MD;  Location: [facility];  Service: Ophthalmology;  Laterality: Left;  CDE:7.93   CHOLECYSTECTOMY     COLONOSCOPY  11/08/2011   Procedure: COLONOSCOPY;  Surgeon: Malissa Hippo, MD;  Location: [facility] SU[facility];  Service: Endoscopy;  Laterality: N/A;  12:15    History reviewed. No pertinent family history.  Social History   Socioeconomic History   Marital status: Divorced    Spouse name: Not on file   Number of children: Not on file   Years of education: Not on file   Highest education level: Not on file  Occupational History   Not on file  Tobacco Use   Smoking status: Former    Current packs/day: 1.00    Average packs/day: 1 pack/day for 40.0 years (40.0 ttl pk-yrs)    Types: Cigarettes   Smokeless tobacco: Current   Tobacco comments:    1 pack a day  Substance and Sexual Activity   Alcohol use: No   Drug use: No   Sexual activity: Not on file  Other Topics Concern   Not on file  Social History Narrative   Not on file   Social Determinants of Health   Financial Resource Strain: Not on file  Food Insecurity: No Food Insecurity (11/15/2021)   Received from [facility], [facility]   Hunger Vital Sign    Worried About  Running Out of Food in the Last Year: Never true    Ran Out of Food in the Last Year: Never true  Transportation Needs: No Transportation Needs (02/23/2021)   Received from [facility], [facility]   [facility] - Transportation    Lack of Transportation (Medical): No    Lack of Transportation (Non-Medical): No  Physical Activity: Not on file  Stress: Not on file  Social Connections: Unknown (08/06/2021)   Received from [facility], [facility]   Social Network    Social Network: Not on file  Intimate Partner Violence: Unknown (06/28/2021)   Received from [facility], [facility]   HITS    Physically Hurt: Not on file    Insult or Talk  Down To: Not on file    Threaten Physical Harm: Not on file    Scream or Curse: Not on file    Outpatient Medications Prior to Visit  Medication Sig Dispense Refill   amitriptyline (ELAVIL) 150 MG tablet Take 1 tablet (150 mg total) by mouth at bedtime. 30 tablet 2   amLODipine (NORVASC) 10 MG tablet Take 1 tablet (10 mg total) by mouth daily. 30 tablet 2   aspirin EC 81 MG tablet Take 1 tablet (81 mg total) by mouth daily. Swallow whole. 30 tablet 2   Iron, Ferrous Sulfate, 325 (65 Fe) MG TABS Take 325 mg by mouth daily. 30 tablet 5   pantoprazole (PROTONIX) 40 MG tablet Take 1 tablet (40 mg total) by mouth daily. 30 tablet 3   rosuvastatin (CRESTOR) 40 MG tablet Take 1 tablet (40 mg total) by mouth daily. 30 tablet 2   senna-docusate (SENOKOT-S) 8.6-50 MG tablet Take 2 tablets by mouth 2 (two) times daily. 120 tablet 2   No facility-administered medications prior to visit.    No Known Allergies  ROS Review of Systems  Constitutional:  Negative for chills and fever.  HENT:  Negative for congestion, sinus pressure, sinus pain and sore throat.   Eyes:  Negative for pain and discharge.  Respiratory:  Negative for cough and shortness of breath.   Cardiovascular:  Negative for chest pain and palpitations.  Gastrointestinal:  Negative for abdominal pain, nausea and vomiting.  Endocrine: Negative for polydipsia and polyuria.  Genitourinary:  Negative for dysuria and hematuria.  Musculoskeletal:  Negative for neck pain and neck stiffness.  Skin:  Negative for rash.       Laceration over left eyelid  Neurological:  Positive for speech difficulty and weakness. Negative for dizziness.  Psychiatric/Behavioral:  Positive for agitation, confusion and decreased concentration.       Objective:    Physical Exam Vitals reviewed.  Constitutional:      General: She is not in acute distress.    Appearance: She is not diaphoretic.  HENT:     Head: Normocephalic and atraumatic.     Nose:  Nose normal.     Mouth/Throat:     Mouth: Mucous membranes are moist.     Comments: Absent teeth Eyes:     General: No scleral icterus.    Extraocular Movements: Extraocular movements intact.  Cardiovascular:     Rate and Rhythm: Normal rate and regular rhythm.     Heart sounds: Normal heart sounds. No murmur heard. Pulmonary:     Breath sounds: Normal breath sounds. No wheezing or rales.  Musculoskeletal:     Cervical back: Neck supple. No tenderness.     Right lower leg: No edema.     Left lower  leg: No edema.  Skin:    General: Skin is warm.     Findings: No rash.     Comments: Laceration over left eyelid - about 5 cm in length, 4 stitches removed today  Neurological:     General: No focal deficit present.     Mental Status: She is alert.     Comments: Alert and oriented to person only  Psychiatric:        Behavior: Behavior is cooperative.        Cognition and Memory: Cognition is impaired.     BP 138/73 (BP Location: Left Arm, Patient Position: Sitting, Cuff Size: Normal)   Pulse 73   Ht 5\' 1"  (1.549 m)   Wt 93 lb 9.6 oz (42.5 kg)   SpO2 98%   BMI 17.69 kg/m  Wt Readings from Last 3 Encounters:  12/18/22 93 lb 9.6 oz (42.5 kg)  11/21/22 94 lb 9.6 oz (42.9 kg)  09/17/22 95 lb (43.1 kg)    No results found for: "TSH" Lab Results  Component Value Date   WBC 7.3 11/21/2022   HGB 10.9 (L) 11/21/2022   HCT 32.6 (L) 11/21/2022   MCV 87 11/21/2022   PLT 364 11/21/2022   Lab Results  Component Value Date   NA 145 (H) 11/21/2022   K 3.4 (L) 11/21/2022   CO2 25 11/21/2022   GLUCOSE 110 (H) 11/21/2022   BUN 12 11/21/2022   CREATININE 0.56 (L) 11/21/2022   BILITOT 0.7 11/21/2022   ALKPHOS 112 11/21/2022   AST 23 11/21/2022   ALT 40 (H) 11/21/2022   PROT 6.6 11/21/2022   ALBUMIN 4.2 11/21/2022   CALCIUM 9.3 11/21/2022   ANIONGAP 12 10/02/2022   EGFR 101 11/21/2022   Lab Results  Component Value Date   CHOL 91 (L) 11/21/2022   Lab Results  Component  Value Date   HDL 46 11/21/2022   Lab Results  Component Value Date   LDLCALC 29 11/21/2022   Lab Results  Component Value Date   TRIG 79 11/21/2022   Lab Results  Component Value Date   CHOLHDL 2.0 11/21/2022   Lab Results  Component Value Date   HGBA1C 5.6 11/21/2022      Assessment & Plan:   Problem List Items Addressed This Visit       Other   History of CVA (cerebrovascular accident)    Has residual expressive aphasia from it On aspirin and statin Dced Plavix due to drop in Hb recently Had agitation due to expressive aphasia On amitriptyline for insomnia and MDD      Encounter for examination following treatment at hospital [facility] chart reviewed, including imaging Fall likely mechanical, had left sided eyelid laceration, well-healing - 4 stitches removed today      Fall    Mechanical fall, no prodromal symptoms Had head injury, but CT head and cervical spine were benign No LOC or seizure-like activity      Iron deficiency anemia due to chronic blood loss    Hb slowly improving Last CBC during ER visit showed Hb of 12.8 On iron supplement On Pantoprazole for concern of PUD       No orders of the defined types were placed in this encounter.   Follow-up: Return if symptoms worsen or fail to improve.    Anabel Halon, MD

## 2022-12-18 NOTE — Telephone Encounter (Signed)
[facility] calling wanting to make sure pt Clopidogrel Rx was supposed to be discontinued. Please advise [phone] Thank you

## 2022-12-18 NOTE — Assessment & Plan Note (Signed)
Mechanical fall, no prodromal symptoms Had head injury, but CT head and cervical spine were benign No LOC or seizure-like activity

## 2022-12-18 NOTE — Assessment & Plan Note (Addendum)
Hospital chart reviewed, including imaging Fall likely mechanical, had left sided eyelid laceration, well-healing - 4 stitches removed today

## 2022-12-18 NOTE — Patient Instructions (Signed)
Please keep area clean and dry. Okay to clean with soap and water.  Please continue to take medications as prescribed.  Please continue to follow low salt diet and ambulate as tolerated.

## 2022-12-18 NOTE — Assessment & Plan Note (Signed)
Hb slowly improving Last CBC during ER visit showed Hb of 12.8 On iron supplement On Pantoprazole for concern of PUD

## 2022-12-18 NOTE — Assessment & Plan Note (Signed)
Has residual expressive aphasia from it On aspirin and statin Dced Plavix due to drop in Hb recently Had agitation due to expressive aphasia On amitriptyline for insomnia and MDD

## 2022-12-18 NOTE — Telephone Encounter (Signed)
[facility] advised

## 2022-12-20 DIAGNOSIS — Z7982 Long term (current) use of aspirin: Secondary | ICD-10-CM | POA: Diagnosis not present

## 2022-12-20 DIAGNOSIS — Z9181 History of falling: Secondary | ICD-10-CM | POA: Diagnosis not present

## 2022-12-20 DIAGNOSIS — E785 Hyperlipidemia, unspecified: Secondary | ICD-10-CM | POA: Diagnosis not present

## 2022-12-20 DIAGNOSIS — I69320 Aphasia following cerebral infarction: Secondary | ICD-10-CM | POA: Diagnosis not present

## 2022-12-20 DIAGNOSIS — I10 Essential (primary) hypertension: Secondary | ICD-10-CM | POA: Diagnosis not present

## 2022-12-20 DIAGNOSIS — G8929 Other chronic pain: Secondary | ICD-10-CM | POA: Diagnosis not present

## 2022-12-20 DIAGNOSIS — R739 Hyperglycemia, unspecified: Secondary | ICD-10-CM | POA: Diagnosis not present

## 2022-12-20 DIAGNOSIS — G43909 Migraine, unspecified, not intractable, without status migrainosus: Secondary | ICD-10-CM | POA: Diagnosis not present

## 2022-12-20 DIAGNOSIS — Z87891 Personal history of nicotine dependence: Secondary | ICD-10-CM | POA: Diagnosis not present

## 2022-12-20 DIAGNOSIS — J449 Chronic obstructive pulmonary disease, unspecified: Secondary | ICD-10-CM | POA: Diagnosis not present

## 2022-12-20 DIAGNOSIS — S01112D Laceration without foreign body of left eyelid and periocular area, subsequent encounter: Secondary | ICD-10-CM | POA: Diagnosis not present

## 2022-12-20 DIAGNOSIS — K219 Gastro-esophageal reflux disease without esophagitis: Secondary | ICD-10-CM | POA: Diagnosis not present

## 2022-12-23 NOTE — Telephone Encounter (Signed)
[facility] called back in regard to previous tele messages   Need discontinue info sent in to pharm so that patient can have meds repacked and sent in.  Would like to have this done today.

## 2022-12-23 NOTE — Telephone Encounter (Signed)
[facility] advised

## 2022-12-25 DIAGNOSIS — S01112D Laceration without foreign body of left eyelid and periocular area, subsequent encounter: Secondary | ICD-10-CM | POA: Diagnosis not present

## 2022-12-25 DIAGNOSIS — E785 Hyperlipidemia, unspecified: Secondary | ICD-10-CM | POA: Diagnosis not present

## 2022-12-25 DIAGNOSIS — I10 Essential (primary) hypertension: Secondary | ICD-10-CM | POA: Diagnosis not present

## 2022-12-25 DIAGNOSIS — Z7982 Long term (current) use of aspirin: Secondary | ICD-10-CM | POA: Diagnosis not present

## 2022-12-25 DIAGNOSIS — R739 Hyperglycemia, unspecified: Secondary | ICD-10-CM | POA: Diagnosis not present

## 2022-12-25 DIAGNOSIS — K219 Gastro-esophageal reflux disease without esophagitis: Secondary | ICD-10-CM | POA: Diagnosis not present

## 2022-12-25 DIAGNOSIS — I69320 Aphasia following cerebral infarction: Secondary | ICD-10-CM | POA: Diagnosis not present

## 2022-12-25 DIAGNOSIS — Z9181 History of falling: Secondary | ICD-10-CM | POA: Diagnosis not present

## 2022-12-25 DIAGNOSIS — J449 Chronic obstructive pulmonary disease, unspecified: Secondary | ICD-10-CM | POA: Diagnosis not present

## 2022-12-25 DIAGNOSIS — G43909 Migraine, unspecified, not intractable, without status migrainosus: Secondary | ICD-10-CM | POA: Diagnosis not present

## 2022-12-25 DIAGNOSIS — Z87891 Personal history of nicotine dependence: Secondary | ICD-10-CM | POA: Diagnosis not present

## 2022-12-25 DIAGNOSIS — G8929 Other chronic pain: Secondary | ICD-10-CM | POA: Diagnosis not present

## 2022-12-30 ENCOUNTER — Telehealth: Payer: Self-pay | Admitting: Internal Medicine

## 2022-12-30 NOTE — Telephone Encounter (Signed)
Bonnie Schneider called from [facility] verbal approval for physical therapy for 1 week for 4 weeks call back # [phone].

## 2022-12-30 NOTE — Telephone Encounter (Signed)
Verbal orders given to stacy

## 2023-01-01 DIAGNOSIS — Z9181 History of falling: Secondary | ICD-10-CM | POA: Diagnosis not present

## 2023-01-01 DIAGNOSIS — R739 Hyperglycemia, unspecified: Secondary | ICD-10-CM | POA: Diagnosis not present

## 2023-01-01 DIAGNOSIS — G8929 Other chronic pain: Secondary | ICD-10-CM | POA: Diagnosis not present

## 2023-01-01 DIAGNOSIS — E785 Hyperlipidemia, unspecified: Secondary | ICD-10-CM | POA: Diagnosis not present

## 2023-01-01 DIAGNOSIS — I6523 Occlusion and stenosis of bilateral carotid arteries: Secondary | ICD-10-CM | POA: Diagnosis not present

## 2023-01-01 DIAGNOSIS — K219 Gastro-esophageal reflux disease without esophagitis: Secondary | ICD-10-CM | POA: Diagnosis not present

## 2023-01-01 DIAGNOSIS — I10 Essential (primary) hypertension: Secondary | ICD-10-CM | POA: Diagnosis not present

## 2023-01-01 DIAGNOSIS — I69320 Aphasia following cerebral infarction: Secondary | ICD-10-CM | POA: Diagnosis not present

## 2023-01-01 DIAGNOSIS — I6522 Occlusion and stenosis of left carotid artery: Secondary | ICD-10-CM | POA: Diagnosis not present

## 2023-01-01 DIAGNOSIS — Z7982 Long term (current) use of aspirin: Secondary | ICD-10-CM | POA: Diagnosis not present

## 2023-01-01 DIAGNOSIS — S01112D Laceration without foreign body of left eyelid and periocular area, subsequent encounter: Secondary | ICD-10-CM | POA: Diagnosis not present

## 2023-01-01 DIAGNOSIS — Z87891 Personal history of nicotine dependence: Secondary | ICD-10-CM | POA: Diagnosis not present

## 2023-01-01 DIAGNOSIS — J449 Chronic obstructive pulmonary disease, unspecified: Secondary | ICD-10-CM | POA: Diagnosis not present

## 2023-01-01 DIAGNOSIS — G43909 Migraine, unspecified, not intractable, without status migrainosus: Secondary | ICD-10-CM | POA: Diagnosis not present

## 2023-01-03 DIAGNOSIS — E785 Hyperlipidemia, unspecified: Secondary | ICD-10-CM | POA: Diagnosis not present

## 2023-01-03 DIAGNOSIS — I10 Essential (primary) hypertension: Secondary | ICD-10-CM | POA: Diagnosis not present

## 2023-01-03 DIAGNOSIS — I69320 Aphasia following cerebral infarction: Secondary | ICD-10-CM | POA: Diagnosis not present

## 2023-01-03 DIAGNOSIS — K219 Gastro-esophageal reflux disease without esophagitis: Secondary | ICD-10-CM | POA: Diagnosis not present

## 2023-01-03 DIAGNOSIS — S01112D Laceration without foreign body of left eyelid and periocular area, subsequent encounter: Secondary | ICD-10-CM | POA: Diagnosis not present

## 2023-01-03 DIAGNOSIS — Z7982 Long term (current) use of aspirin: Secondary | ICD-10-CM | POA: Diagnosis not present

## 2023-01-03 DIAGNOSIS — Z9181 History of falling: Secondary | ICD-10-CM | POA: Diagnosis not present

## 2023-01-03 DIAGNOSIS — R739 Hyperglycemia, unspecified: Secondary | ICD-10-CM | POA: Diagnosis not present

## 2023-01-03 DIAGNOSIS — G43909 Migraine, unspecified, not intractable, without status migrainosus: Secondary | ICD-10-CM | POA: Diagnosis not present

## 2023-01-03 DIAGNOSIS — Z87891 Personal history of nicotine dependence: Secondary | ICD-10-CM | POA: Diagnosis not present

## 2023-01-03 DIAGNOSIS — G8929 Other chronic pain: Secondary | ICD-10-CM | POA: Diagnosis not present

## 2023-01-03 DIAGNOSIS — J449 Chronic obstructive pulmonary disease, unspecified: Secondary | ICD-10-CM | POA: Diagnosis not present

## 2023-01-04 DIAGNOSIS — N289 Disorder of kidney and ureter, unspecified: Secondary | ICD-10-CM | POA: Diagnosis not present

## 2023-01-04 DIAGNOSIS — Z1152 Encounter for screening for COVID-19: Secondary | ICD-10-CM | POA: Diagnosis not present

## 2023-01-04 DIAGNOSIS — K5730 Diverticulosis of large intestine without perforation or abscess without bleeding: Secondary | ICD-10-CM | POA: Diagnosis not present

## 2023-01-04 DIAGNOSIS — Z20822 Contact with and (suspected) exposure to covid-19: Secondary | ICD-10-CM | POA: Diagnosis not present

## 2023-01-04 DIAGNOSIS — E876 Hypokalemia: Secondary | ICD-10-CM | POA: Diagnosis not present

## 2023-01-04 DIAGNOSIS — R935 Abnormal findings on diagnostic imaging of other abdominal regions, including retroperitoneum: Secondary | ICD-10-CM | POA: Diagnosis not present

## 2023-01-04 DIAGNOSIS — K529 Noninfective gastroenteritis and colitis, unspecified: Secondary | ICD-10-CM | POA: Diagnosis not present

## 2023-01-04 DIAGNOSIS — I69320 Aphasia following cerebral infarction: Secondary | ICD-10-CM | POA: Diagnosis not present

## 2023-01-04 DIAGNOSIS — R0789 Other chest pain: Secondary | ICD-10-CM | POA: Diagnosis not present

## 2023-01-04 DIAGNOSIS — R079 Chest pain, unspecified: Secondary | ICD-10-CM | POA: Diagnosis not present

## 2023-01-04 DIAGNOSIS — I700 Atherosclerosis of aorta: Secondary | ICD-10-CM | POA: Diagnosis not present

## 2023-01-04 DIAGNOSIS — D7389 Other diseases of spleen: Secondary | ICD-10-CM | POA: Diagnosis not present

## 2023-01-04 DIAGNOSIS — E785 Hyperlipidemia, unspecified: Secondary | ICD-10-CM | POA: Diagnosis not present

## 2023-01-04 DIAGNOSIS — I10 Essential (primary) hypertension: Secondary | ICD-10-CM | POA: Diagnosis not present

## 2023-01-08 ENCOUNTER — Telehealth: Payer: Self-pay | Admitting: Internal Medicine

## 2023-01-08 ENCOUNTER — Other Ambulatory Visit: Payer: Self-pay | Admitting: Internal Medicine

## 2023-01-08 DIAGNOSIS — G43909 Migraine, unspecified, not intractable, without status migrainosus: Secondary | ICD-10-CM | POA: Diagnosis not present

## 2023-01-08 DIAGNOSIS — E785 Hyperlipidemia, unspecified: Secondary | ICD-10-CM | POA: Diagnosis not present

## 2023-01-08 DIAGNOSIS — Z9181 History of falling: Secondary | ICD-10-CM | POA: Diagnosis not present

## 2023-01-08 DIAGNOSIS — I69320 Aphasia following cerebral infarction: Secondary | ICD-10-CM | POA: Diagnosis not present

## 2023-01-08 DIAGNOSIS — Z87891 Personal history of nicotine dependence: Secondary | ICD-10-CM | POA: Diagnosis not present

## 2023-01-08 DIAGNOSIS — I10 Essential (primary) hypertension: Secondary | ICD-10-CM | POA: Diagnosis not present

## 2023-01-08 DIAGNOSIS — K219 Gastro-esophageal reflux disease without esophagitis: Secondary | ICD-10-CM | POA: Diagnosis not present

## 2023-01-08 DIAGNOSIS — S01112D Laceration without foreign body of left eyelid and periocular area, subsequent encounter: Secondary | ICD-10-CM | POA: Diagnosis not present

## 2023-01-08 DIAGNOSIS — R739 Hyperglycemia, unspecified: Secondary | ICD-10-CM | POA: Diagnosis not present

## 2023-01-08 DIAGNOSIS — J449 Chronic obstructive pulmonary disease, unspecified: Secondary | ICD-10-CM | POA: Diagnosis not present

## 2023-01-08 DIAGNOSIS — Z7982 Long term (current) use of aspirin: Secondary | ICD-10-CM | POA: Diagnosis not present

## 2023-01-08 DIAGNOSIS — G8929 Other chronic pain: Secondary | ICD-10-CM | POA: Diagnosis not present

## 2023-01-08 NOTE — Telephone Encounter (Signed)
Prescription Request  01/08/2023  LOV: 12/18/2022  What is the name of the medication or equipment? aspirin EC 81 MG table   senna-docusate (SENOKOT-S) 8.6-50 MG tablet [[id number]]     amitriptyline (ELAVIL) 150 MG tablet [[id number]]    Have you contacted your pharmacy to request a refill? Yes   Which pharmacy would you like this sent to?  [facility]  Patient notified that their request is being sent to the clinical staff for review and that they should receive a response within 2 business days.   Please advise at Mobile [phone] (mobile)

## 2023-01-09 DIAGNOSIS — K219 Gastro-esophageal reflux disease without esophagitis: Secondary | ICD-10-CM | POA: Diagnosis not present

## 2023-01-09 DIAGNOSIS — R739 Hyperglycemia, unspecified: Secondary | ICD-10-CM | POA: Diagnosis not present

## 2023-01-09 DIAGNOSIS — J449 Chronic obstructive pulmonary disease, unspecified: Secondary | ICD-10-CM | POA: Diagnosis not present

## 2023-01-09 DIAGNOSIS — G43909 Migraine, unspecified, not intractable, without status migrainosus: Secondary | ICD-10-CM | POA: Diagnosis not present

## 2023-01-09 DIAGNOSIS — Z9181 History of falling: Secondary | ICD-10-CM | POA: Diagnosis not present

## 2023-01-09 DIAGNOSIS — Z87891 Personal history of nicotine dependence: Secondary | ICD-10-CM | POA: Diagnosis not present

## 2023-01-09 DIAGNOSIS — I69320 Aphasia following cerebral infarction: Secondary | ICD-10-CM | POA: Diagnosis not present

## 2023-01-09 DIAGNOSIS — E785 Hyperlipidemia, unspecified: Secondary | ICD-10-CM | POA: Diagnosis not present

## 2023-01-09 DIAGNOSIS — G8929 Other chronic pain: Secondary | ICD-10-CM | POA: Diagnosis not present

## 2023-01-09 DIAGNOSIS — Z7982 Long term (current) use of aspirin: Secondary | ICD-10-CM | POA: Diagnosis not present

## 2023-01-09 DIAGNOSIS — S01112D Laceration without foreign body of left eyelid and periocular area, subsequent encounter: Secondary | ICD-10-CM | POA: Diagnosis not present

## 2023-01-09 DIAGNOSIS — I10 Essential (primary) hypertension: Secondary | ICD-10-CM | POA: Diagnosis not present

## 2023-01-15 DIAGNOSIS — R739 Hyperglycemia, unspecified: Secondary | ICD-10-CM | POA: Diagnosis not present

## 2023-01-15 DIAGNOSIS — K219 Gastro-esophageal reflux disease without esophagitis: Secondary | ICD-10-CM | POA: Diagnosis not present

## 2023-01-15 DIAGNOSIS — I10 Essential (primary) hypertension: Secondary | ICD-10-CM | POA: Diagnosis not present

## 2023-01-15 DIAGNOSIS — S01112D Laceration without foreign body of left eyelid and periocular area, subsequent encounter: Secondary | ICD-10-CM | POA: Diagnosis not present

## 2023-01-15 DIAGNOSIS — Z9181 History of falling: Secondary | ICD-10-CM | POA: Diagnosis not present

## 2023-01-15 DIAGNOSIS — G43909 Migraine, unspecified, not intractable, without status migrainosus: Secondary | ICD-10-CM | POA: Diagnosis not present

## 2023-01-15 DIAGNOSIS — J449 Chronic obstructive pulmonary disease, unspecified: Secondary | ICD-10-CM | POA: Diagnosis not present

## 2023-01-15 DIAGNOSIS — Z87891 Personal history of nicotine dependence: Secondary | ICD-10-CM | POA: Diagnosis not present

## 2023-01-15 DIAGNOSIS — Z7982 Long term (current) use of aspirin: Secondary | ICD-10-CM | POA: Diagnosis not present

## 2023-01-15 DIAGNOSIS — G8929 Other chronic pain: Secondary | ICD-10-CM | POA: Diagnosis not present

## 2023-01-15 DIAGNOSIS — E785 Hyperlipidemia, unspecified: Secondary | ICD-10-CM | POA: Diagnosis not present

## 2023-01-15 DIAGNOSIS — I69320 Aphasia following cerebral infarction: Secondary | ICD-10-CM | POA: Diagnosis not present

## 2023-01-20 ENCOUNTER — Ambulatory Visit: Payer: 59 | Admitting: Internal Medicine

## 2023-01-22 DIAGNOSIS — K219 Gastro-esophageal reflux disease without esophagitis: Secondary | ICD-10-CM | POA: Diagnosis not present

## 2023-01-22 DIAGNOSIS — J449 Chronic obstructive pulmonary disease, unspecified: Secondary | ICD-10-CM | POA: Diagnosis not present

## 2023-01-22 DIAGNOSIS — E785 Hyperlipidemia, unspecified: Secondary | ICD-10-CM | POA: Diagnosis not present

## 2023-01-22 DIAGNOSIS — I69320 Aphasia following cerebral infarction: Secondary | ICD-10-CM | POA: Diagnosis not present

## 2023-01-22 DIAGNOSIS — Z87891 Personal history of nicotine dependence: Secondary | ICD-10-CM | POA: Diagnosis not present

## 2023-01-22 DIAGNOSIS — G43909 Migraine, unspecified, not intractable, without status migrainosus: Secondary | ICD-10-CM | POA: Diagnosis not present

## 2023-01-22 DIAGNOSIS — R739 Hyperglycemia, unspecified: Secondary | ICD-10-CM | POA: Diagnosis not present

## 2023-01-22 DIAGNOSIS — S01112D Laceration without foreign body of left eyelid and periocular area, subsequent encounter: Secondary | ICD-10-CM | POA: Diagnosis not present

## 2023-01-22 DIAGNOSIS — Z9181 History of falling: Secondary | ICD-10-CM | POA: Diagnosis not present

## 2023-01-22 DIAGNOSIS — I10 Essential (primary) hypertension: Secondary | ICD-10-CM | POA: Diagnosis not present

## 2023-01-22 DIAGNOSIS — Z7982 Long term (current) use of aspirin: Secondary | ICD-10-CM | POA: Diagnosis not present

## 2023-01-22 DIAGNOSIS — G8929 Other chronic pain: Secondary | ICD-10-CM | POA: Diagnosis not present

## 2023-01-29 DIAGNOSIS — R739 Hyperglycemia, unspecified: Secondary | ICD-10-CM | POA: Diagnosis not present

## 2023-01-29 DIAGNOSIS — G8929 Other chronic pain: Secondary | ICD-10-CM | POA: Diagnosis not present

## 2023-01-29 DIAGNOSIS — E785 Hyperlipidemia, unspecified: Secondary | ICD-10-CM | POA: Diagnosis not present

## 2023-01-29 DIAGNOSIS — J449 Chronic obstructive pulmonary disease, unspecified: Secondary | ICD-10-CM | POA: Diagnosis not present

## 2023-01-29 DIAGNOSIS — S01112D Laceration without foreign body of left eyelid and periocular area, subsequent encounter: Secondary | ICD-10-CM | POA: Diagnosis not present

## 2023-01-29 DIAGNOSIS — I10 Essential (primary) hypertension: Secondary | ICD-10-CM | POA: Diagnosis not present

## 2023-01-29 DIAGNOSIS — Z7982 Long term (current) use of aspirin: Secondary | ICD-10-CM | POA: Diagnosis not present

## 2023-01-29 DIAGNOSIS — Z9181 History of falling: Secondary | ICD-10-CM | POA: Diagnosis not present

## 2023-01-29 DIAGNOSIS — K219 Gastro-esophageal reflux disease without esophagitis: Secondary | ICD-10-CM | POA: Diagnosis not present

## 2023-01-29 DIAGNOSIS — Z87891 Personal history of nicotine dependence: Secondary | ICD-10-CM | POA: Diagnosis not present

## 2023-01-29 DIAGNOSIS — G43909 Migraine, unspecified, not intractable, without status migrainosus: Secondary | ICD-10-CM | POA: Diagnosis not present

## 2023-01-29 DIAGNOSIS — I69320 Aphasia following cerebral infarction: Secondary | ICD-10-CM | POA: Diagnosis not present

## 2023-02-05 DIAGNOSIS — I69320 Aphasia following cerebral infarction: Secondary | ICD-10-CM | POA: Diagnosis not present

## 2023-02-05 DIAGNOSIS — G43909 Migraine, unspecified, not intractable, without status migrainosus: Secondary | ICD-10-CM | POA: Diagnosis not present

## 2023-02-05 DIAGNOSIS — Z7982 Long term (current) use of aspirin: Secondary | ICD-10-CM | POA: Diagnosis not present

## 2023-02-05 DIAGNOSIS — Z9181 History of falling: Secondary | ICD-10-CM | POA: Diagnosis not present

## 2023-02-05 DIAGNOSIS — G8929 Other chronic pain: Secondary | ICD-10-CM | POA: Diagnosis not present

## 2023-02-05 DIAGNOSIS — K219 Gastro-esophageal reflux disease without esophagitis: Secondary | ICD-10-CM | POA: Diagnosis not present

## 2023-02-05 DIAGNOSIS — J449 Chronic obstructive pulmonary disease, unspecified: Secondary | ICD-10-CM | POA: Diagnosis not present

## 2023-02-05 DIAGNOSIS — R739 Hyperglycemia, unspecified: Secondary | ICD-10-CM | POA: Diagnosis not present

## 2023-02-05 DIAGNOSIS — I10 Essential (primary) hypertension: Secondary | ICD-10-CM | POA: Diagnosis not present

## 2023-02-05 DIAGNOSIS — S01112D Laceration without foreign body of left eyelid and periocular area, subsequent encounter: Secondary | ICD-10-CM | POA: Diagnosis not present

## 2023-02-05 DIAGNOSIS — Z87891 Personal history of nicotine dependence: Secondary | ICD-10-CM | POA: Diagnosis not present

## 2023-02-05 DIAGNOSIS — E785 Hyperlipidemia, unspecified: Secondary | ICD-10-CM | POA: Diagnosis not present

## 2023-02-08 DIAGNOSIS — I10 Essential (primary) hypertension: Secondary | ICD-10-CM | POA: Diagnosis not present

## 2023-02-08 DIAGNOSIS — R404 Transient alteration of awareness: Secondary | ICD-10-CM | POA: Diagnosis not present

## 2023-02-08 DIAGNOSIS — Z79899 Other long term (current) drug therapy: Secondary | ICD-10-CM | POA: Diagnosis not present

## 2023-02-08 DIAGNOSIS — I700 Atherosclerosis of aorta: Secondary | ICD-10-CM | POA: Diagnosis not present

## 2023-02-08 DIAGNOSIS — Z743 Need for continuous supervision: Secondary | ICD-10-CM | POA: Diagnosis not present

## 2023-02-08 DIAGNOSIS — Z0000 Encounter for general adult medical examination without abnormal findings: Secondary | ICD-10-CM | POA: Diagnosis not present

## 2023-02-08 DIAGNOSIS — R109 Unspecified abdominal pain: Secondary | ICD-10-CM | POA: Diagnosis not present

## 2023-02-24 ENCOUNTER — Telehealth: Payer: Self-pay | Admitting: Internal Medicine

## 2023-02-24 NOTE — Telephone Encounter (Signed)
Social security administration form From [facility]   Noted Copied Sleeved (put in provider box)

## 2023-02-25 ENCOUNTER — Telehealth: Payer: Self-pay | Admitting: Internal Medicine

## 2023-02-25 NOTE — Telephone Encounter (Signed)
Faxed to social security

## 2023-02-25 NOTE — Telephone Encounter (Signed)
Copied from CRM [id number]. Topic: General - Other >> Feb 25, 2023  9:49 AM Dimitri Ped wrote: Reason for CRM: Ms Daphine from [facility] calling about paperwork that Dr patel was suppose to be signing from social security office for patient and to be sent back to social security office . Ms Daphine just wanting to make sure they was faxed back to social security office . The number was on front of paperwork .the paperwork is a  medical source of patient capabilities to manage benefits about 2 pages Call back number [phone]

## 2023-02-27 ENCOUNTER — Encounter: Payer: Self-pay | Admitting: Internal Medicine

## 2023-02-27 ENCOUNTER — Ambulatory Visit: Payer: 59 | Admitting: Internal Medicine

## 2023-02-27 VITALS — BP 152/94 | HR 69 | Ht 61.0 in | Wt 93.2 lb

## 2023-02-27 DIAGNOSIS — F333 Major depressive disorder, recurrent, severe with psychotic symptoms: Secondary | ICD-10-CM

## 2023-02-27 DIAGNOSIS — I10 Essential (primary) hypertension: Secondary | ICD-10-CM

## 2023-02-27 DIAGNOSIS — D500 Iron deficiency anemia secondary to blood loss (chronic): Secondary | ICD-10-CM

## 2023-02-27 DIAGNOSIS — Z8673 Personal history of transient ischemic attack (TIA), and cerebral infarction without residual deficits: Secondary | ICD-10-CM | POA: Diagnosis not present

## 2023-02-27 DIAGNOSIS — Z09 Encounter for follow-up examination after completed treatment for conditions other than malignant neoplasm: Secondary | ICD-10-CM | POA: Diagnosis not present

## 2023-02-27 DIAGNOSIS — R4701 Aphasia: Secondary | ICD-10-CM

## 2023-02-27 MED ORDER — AMLODIPINE-OLMESARTAN 10-20 MG PO TABS
1.0000 | ORAL_TABLET | Freq: Every day | ORAL | 3 refills | Status: DC
Start: 2023-02-27 — End: 2023-06-03

## 2023-02-27 NOTE — Assessment & Plan Note (Signed)
Has residual expressive aphasia from it On aspirin and statin Dced Plavix Had agitation due to expressive aphasia On amitriptyline for insomnia and MDD

## 2023-02-27 NOTE — Assessment & Plan Note (Signed)
Hospital chart reviewed, including blood tests Apparently she did not have syncope or seizure-like activity, but prefers to avoid any conversation at times, but it is perceived as bizarre behavior and/or syncope by group home staff

## 2023-02-27 NOTE — Assessment & Plan Note (Signed)
She has longstanding history of MDD Has episodes of agitation at times Continue amitriptyline 150 mg QD Has been referred to psychiatry

## 2023-02-27 NOTE — Assessment & Plan Note (Signed)
BP Readings from Last 1 Encounters:  02/27/23 (!) 152/94   Uncontrolled with amlodipine 10 mg once daily Started Azor 10-20 mg QD instead of amlodipine 10 mg QD Counseled for compliance with the medications Advised DASH diet

## 2023-02-27 NOTE — Patient Instructions (Signed)
Please start taking amlodipine-olmesartan 10-20 mg once daily.  Please continue to take medications as prescribed.  Please continue to follow low salt diet and ambulate as tolerated.  Please get fasting blood tests done after 2 weeks.

## 2023-02-27 NOTE — Assessment & Plan Note (Addendum)
Hb slowly improving Last CBC during ER visit showed Hb of 13.6 On iron supplement On Pantoprazole for concern of PUD

## 2023-02-27 NOTE — Assessment & Plan Note (Signed)
Due to CVA It is contributing to her agitation mostly -although has underlying MDD as well Would benefit from speech therapy up to certain extent-home health referral provided for speech therapy

## 2023-02-27 NOTE — Progress Notes (Signed)
Established Patient Office Visit  Subjective:  Patient ID: Bonnie Schneider, female    DOB: March 24, 1956  Age: 67 y.o. MRN: [id number]  CC:  Chief Complaint  Patient presents with   Hypertension    Follow up    Depression    Follow up    Follow-up    ER follow up for passing out due to low potassium     HPI Bonnie Schneider is a 67 y.o. female with past medical history of HTN, CVA with residual expressive aphasia, HLD, MDD and dementia who presents for f/u of recent ER visit and f/u of her chronic medical conditions.  She presented to the emergency department on 02/08/23 due to to concern for episode of passing out.  ER chart suggest that she was sitting outside her group home and did not respond to the staff when she was asked to come inside for lunch.  She was at baseline in terms of her physical exam during ER visit, had mild hypokalemia, UA was unremarkable.  Of note, her CBC showed improvement in Hb from 10.9 to 13.6 now. She was advised to stop taking Plavix in the last visit. She has also been placed on Pantoprazole for concern for occult GI bleeding/PUD. She denies any melena or hematochezia. She still takes Aspirin and statin for h/o CVA.  Her BP is elevated today.  Her BP has been elevated at group home as well.  She currently takes amlodipine 10 mg QD.  Denies any headache, dizziness, chest pain, dyspnea or palpitations.  She currently is A&O X 2, has expressive aphasia and gets agitated very easily due to it. She is able to write, but has limited skills left.  According to her sister, she had history of uncontrolled HTN before CVA, and was noncompliant to her treatment.  She has longstanding history of MDD.  She has episodes of agitation currently.  She takes amitriptyline 150 mg nightly.  She had ER visit in 06/24, followed by inpatient psychiatric admission for agitation.  Her social worker reports that she has very frequent agitation spells while conversing with her  caregivers at the adult group home.    Past Medical History:  Diagnosis Date   Chronic back pain    COPD (chronic obstructive pulmonary disease) (HCC)    Depression    GERD (gastroesophageal reflux disease)    Hypertension    Immature cataract    Migraine    last migraine 11/07/11   Shortness of breath    with exertion   Vascular dementia [facility])     Past Surgical History:  Procedure Laterality Date   CATARACT EXTRACTION W/PHACO Right 01/18/2013   Procedure: CATARACT EXTRACTION PHACO AND INTRAOCULAR LENS PLACEMENT (IOC);  Surgeon: Gemma Payor, MD;  Location: [facility];  Service: Ophthalmology;  Laterality: Right;  CDE:12.85   CATARACT EXTRACTION W/PHACO Left 01/28/2013   Procedure: CATARACT EXTRACTION PHACO AND INTRAOCULAR LENS PLACEMENT (IOC);  Surgeon: Gemma Payor, MD;  Location: [facility];  Service: Ophthalmology;  Laterality: Left;  CDE:7.93   CHOLECYSTECTOMY     COLONOSCOPY  11/08/2011   Procedure: COLONOSCOPY;  Surgeon: Malissa Hippo, MD;  Location: [facility];  Service: Endoscopy;  Laterality: N/A;  12:15    History reviewed. No pertinent family history.  Social History   Socioeconomic History   Marital status: Divorced    Spouse name: Not on file   Number of children: Not on file   Years of education: Not on file   Highest  education level: Not on file  Occupational History   Not on file  Tobacco Use   Smoking status: Former    Current packs/day: 1.00    Average packs/day: 1 pack/day for 40.0 years (40.0 ttl pk-yrs)    Types: Cigarettes   Smokeless tobacco: Current   Tobacco comments:    1 pack a day  Substance and Sexual Activity   Alcohol use: No   Drug use: No   Sexual activity: Not on file  Other Topics Concern   Not on file  Social History Narrative   Not on file   Social Determinants of Health   Financial Resource Strain: Not on file  Food Insecurity: No Food Insecurity (11/15/2021)   Received from [facility], [facility]   Hunger Vital Sign     Worried About Running Out of Food in the Last Year: Never true    Ran Out of Food in the Last Year: Never true  Transportation Needs: No Transportation Needs (02/23/2021)   Received from [facility], [facility]   PRAPARE - Transportation    Lack of Transportation (Medical): No    Lack of Transportation (Non-Medical): No  Physical Activity: Not on file  Stress: Not on file  Social Connections: Unknown (08/06/2021)   Received from [facility], [facility]   Social Network    Social Network: Not on file  Intimate Partner Violence: Unknown (06/28/2021)   Received from [facility], [facility]   HITS    Physically Hurt: Not on file    Insult or Talk Down To: Not on file    Threaten Physical Harm: Not on file    Scream or Curse: Not on file    Outpatient Medications Prior to Visit  Medication Sig Dispense Refill   amitriptyline (ELAVIL) 150 MG tablet TAKE 1 TABLET BY MOUTH AT BEDTIME. 30 tablet 0   ASPIRIN EC ADULT LOW DOSE 81 MG tablet TAKE 1 TBALET BY MOUTH DAILY (SWALLOW WHOLE). 30 tablet 0   Iron, Ferrous Sulfate, 325 (65 Fe) MG TABS Take 325 mg by mouth daily. 30 tablet 5   pantoprazole (PROTONIX) 40 MG tablet Take 1 tablet (40 mg total) by mouth daily. 30 tablet 3   rosuvastatin (CRESTOR) 40 MG tablet Take 1 tablet (40 mg total) by mouth daily. 30 tablet 2   senna-docusate (SENOKOT-S) 8.6-50 MG tablet TAKE 2 TABLETS BY MOUTH 2 TIMES DAILY. 60 tablet 0   amLODipine (NORVASC) 10 MG tablet Take 1 tablet (10 mg total) by mouth daily. 30 tablet 2   No facility-administered medications prior to visit.    No Known Allergies  ROS Review of Systems  Constitutional:  Negative for chills and fever.  HENT:  Negative for congestion, sinus pressure, sinus pain and sore throat.   Eyes:  Negative for pain and discharge.  Respiratory:  Negative for cough and shortness of breath.   Cardiovascular:  Negative for chest pain and palpitations.  Gastrointestinal:  Negative for abdominal  pain, nausea and vomiting.  Endocrine: Negative for polydipsia and polyuria.  Genitourinary:  Negative for dysuria and hematuria.  Musculoskeletal:  Negative for neck pain and neck stiffness.  Skin:  Negative for rash.       Laceration over left eyelid  Neurological:  Positive for speech difficulty and weakness. Negative for dizziness.  Psychiatric/Behavioral:  Positive for agitation, confusion and decreased concentration.       Objective:    Physical Exam Vitals reviewed.  Constitutional:      General:  She is not in acute distress.    Appearance: She is not diaphoretic.  HENT:     Head: Normocephalic and atraumatic.     Nose: Nose normal.     Mouth/Throat:     Mouth: Mucous membranes are moist.     Comments: Absent teeth Eyes:     General: No scleral icterus.    Extraocular Movements: Extraocular movements intact.  Cardiovascular:     Rate and Rhythm: Normal rate and regular rhythm.     Heart sounds: Normal heart sounds. No murmur heard. Pulmonary:     Breath sounds: Normal breath sounds. No wheezing or rales.  Musculoskeletal:     Cervical back: Neck supple. No tenderness.     Right lower leg: No edema.     Left lower leg: No edema.  Skin:    General: Skin is warm.     Findings: No rash.     Comments: Laceration over left eyelid - about 5 cm in length, 4 stitches removed today  Neurological:     General: No focal deficit present.     Mental Status: She is alert.     Comments: Alert and oriented to person and place  Psychiatric:        Mood and Affect: Mood is anxious.        Behavior: Behavior is cooperative.        Cognition and Memory: Memory is impaired.     BP (!) 152/94 (BP Location: Right Arm)   Pulse 69   Ht 5\' 1"  (1.549 m)   Wt 93 lb 3.2 oz (42.3 kg)   SpO2 97%   BMI 17.61 kg/m  Wt Readings from Last 3 Encounters:  02/27/23 93 lb 3.2 oz (42.3 kg)  12/18/22 93 lb 9.6 oz (42.5 kg)  11/21/22 94 lb 9.6 oz (42.9 kg)    No results found for:  "TSH" Lab Results  Component Value Date   WBC 7.3 11/21/2022   HGB 10.9 (L) 11/21/2022   HCT 32.6 (L) 11/21/2022   MCV 87 11/21/2022   PLT 364 11/21/2022   Lab Results  Component Value Date   NA 145 (H) 11/21/2022   K 3.4 (L) 11/21/2022   CO2 25 11/21/2022   GLUCOSE 110 (H) 11/21/2022   BUN 12 11/21/2022   CREATININE 0.56 (L) 11/21/2022   BILITOT 0.7 11/21/2022   ALKPHOS 112 11/21/2022   AST 23 11/21/2022   ALT 40 (H) 11/21/2022   PROT 6.6 11/21/2022   ALBUMIN 4.2 11/21/2022   CALCIUM 9.3 11/21/2022   ANIONGAP 12 10/02/2022   EGFR 101 11/21/2022   Lab Results  Component Value Date   CHOL 91 (L) 11/21/2022   Lab Results  Component Value Date   HDL 46 11/21/2022   Lab Results  Component Value Date   LDLCALC 29 11/21/2022   Lab Results  Component Value Date   TRIG 79 11/21/2022   Lab Results  Component Value Date   CHOLHDL 2.0 11/21/2022   Lab Results  Component Value Date   HGBA1C 5.6 11/21/2022      Assessment & Plan:   Problem List Items Addressed This Visit       Cardiovascular and Mediastinum   Essential hypertension    BP Readings from Last 1 Encounters:  02/27/23 (!) 152/94   Uncontrolled with amlodipine 10 mg once daily Started Azor 10-20 mg QD instead of amlodipine 10 mg QD Counseled for compliance with the medications Advised DASH diet  Relevant Medications   amlodipine-olmesartan (AZOR) 10-20 MG tablet   Other Relevant Orders   Basic Metabolic Panel (BMET)   CBC with Differential/Platelet     Other   History of CVA (cerebrovascular accident) - Primary    Has residual expressive aphasia from it On aspirin and statin Dced Plavix Had agitation due to expressive aphasia On amitriptyline for insomnia and MDD      Relevant Orders   CBC with Differential/Platelet   Expressive aphasia    Due to CVA It is contributing to her agitation mostly -although has underlying MDD as well Would benefit from speech therapy up to certain  extent-home health referral provided for speech therapy      Severe episode of recurrent major depressive disorder, with psychotic features (HCC)    She has longstanding history of MDD Has episodes of agitation at times Continue amitriptyline 150 mg QD Has been referred to psychiatry      Encounter for examination following treatment at hospital    Hospital chart reviewed, including blood tests Apparently she did not have syncope or seizure-like activity, but prefers to avoid any conversation at times, but it is perceived as bizarre behavior and/or syncope by group home staff      Iron deficiency anemia due to chronic blood loss    Hb slowly improving Last CBC during ER visit showed Hb of 13.6 On iron supplement On Pantoprazole for concern of PUD      Relevant Orders   CBC with Differential/Platelet    Meds ordered this encounter  Medications   amlodipine-olmesartan (AZOR) 10-20 MG tablet    Sig: Take 1 tablet by mouth daily.    Dispense:  30 tablet    Refill:  3    Please discontinue Amlodipine 10 mg.    Follow-up: Return in about 3 months (around 05/28/2023) for HTN.    Anabel Halon, MD

## 2023-05-28 ENCOUNTER — Ambulatory Visit: Payer: 59 | Admitting: Internal Medicine

## 2023-06-02 ENCOUNTER — Other Ambulatory Visit: Payer: Self-pay | Admitting: Internal Medicine

## 2023-06-02 DIAGNOSIS — I10 Essential (primary) hypertension: Secondary | ICD-10-CM

## 2023-06-09 ENCOUNTER — Telehealth: Payer: Self-pay | Admitting: Internal Medicine

## 2023-06-09 NOTE — Telephone Encounter (Signed)
FL2  Noted  Copied Sleeved  Original in PCP box Copy front desk folder

## 2023-06-17 ENCOUNTER — Ambulatory Visit (INDEPENDENT_AMBULATORY_CARE_PROVIDER_SITE_OTHER): Payer: Self-pay | Admitting: Internal Medicine

## 2023-06-17 ENCOUNTER — Encounter: Payer: Self-pay | Admitting: Internal Medicine

## 2023-06-17 VITALS — BP 138/82 | HR 76 | Ht 61.0 in | Wt 90.4 lb

## 2023-06-17 DIAGNOSIS — I10 Essential (primary) hypertension: Secondary | ICD-10-CM

## 2023-06-17 DIAGNOSIS — Z1231 Encounter for screening mammogram for malignant neoplasm of breast: Secondary | ICD-10-CM | POA: Diagnosis not present

## 2023-06-17 DIAGNOSIS — Z8673 Personal history of transient ischemic attack (TIA), and cerebral infarction without residual deficits: Secondary | ICD-10-CM

## 2023-06-17 DIAGNOSIS — D500 Iron deficiency anemia secondary to blood loss (chronic): Secondary | ICD-10-CM | POA: Diagnosis not present

## 2023-06-17 DIAGNOSIS — Z13820 Encounter for screening for osteoporosis: Secondary | ICD-10-CM

## 2023-06-17 DIAGNOSIS — F333 Major depressive disorder, recurrent, severe with psychotic symptoms: Secondary | ICD-10-CM

## 2023-06-17 NOTE — Patient Instructions (Signed)
 Please continue to take medications as prescribed.  Please continue to follow low salt diet and perform moderate exercise/walking as tolerated.

## 2023-06-17 NOTE — Progress Notes (Unsigned)
 Established Patient Office Visit  Subjective:  Patient ID: Bonnie Schneider, female    DOB: 10-10-1955  Age: 68 y.o. MRN: [id number]  CC:  Chief Complaint  Patient presents with   Care Management    3 month f/u , reports doing better.     HPI Bonnie Schneider is a 68 y.o. female with past medical history of HTN, CVA with residual expressive aphasia, HLD, MDD and dementia who presents for f/u of recent ER visit and f/u of her chronic medical conditions.  She presented to the emergency department on 02/08/23 due to to concern for episode of passing out.  ER chart suggest that she was sitting outside her group home and did not respond to the staff when she was asked to come inside for lunch.  She was at baseline in terms of her physical exam during ER visit, had mild hypokalemia, UA was unremarkable.  Of note, her CBC showed improvement in Hb from 10.9 to 13.6 now. She was advised to stop taking Plavix in the last visit. She has also been placed on Pantoprazole for concern for occult GI bleeding/PUD. She denies any melena or hematochezia. She still takes Aspirin and statin for h/o CVA.  Her BP is elevated today.  Her BP has been elevated at group home as well.  She currently takes amlodipine 10 mg QD.  Denies any headache, dizziness, chest pain, dyspnea or palpitations.  She currently is A&O X 2, has expressive aphasia and gets agitated very easily due to it. She is able to write, but has limited skills left.  According to her sister, she had history of uncontrolled HTN before CVA, and was noncompliant to her treatment.  She has longstanding history of MDD.  She has episodes of agitation currently.  She takes amitriptyline 150 mg nightly.  She had ER visit in 06/24, followed by inpatient psychiatric admission for agitation.  Her social worker reports that she has very frequent agitation spells while conversing with her caregivers at the adult group home.    Past Medical History:  Diagnosis  Date   Chronic back pain    COPD (chronic obstructive pulmonary disease) (HCC)    Depression    GERD (gastroesophageal reflux disease)    Hypertension    Immature cataract    Migraine    last migraine 11/07/11   Shortness of breath    with exertion   Vascular dementia [facility])     Past Surgical History:  Procedure Laterality Date   CATARACT EXTRACTION W/PHACO Right 01/18/2013   Procedure: CATARACT EXTRACTION PHACO AND INTRAOCULAR LENS PLACEMENT (IOC);  Surgeon: Gemma Payor, MD;  Location: [facility];  Service: Ophthalmology;  Laterality: Right;  CDE:12.85   CATARACT EXTRACTION W/PHACO Left 01/28/2013   Procedure: CATARACT EXTRACTION PHACO AND INTRAOCULAR LENS PLACEMENT (IOC);  Surgeon: Gemma Payor, MD;  Location: [facility];  Service: Ophthalmology;  Laterality: Left;  CDE:7.93   CHOLECYSTECTOMY     COLONOSCOPY  11/08/2011   Procedure: COLONOSCOPY;  Surgeon: Malissa Hippo, MD;  Location: [facility];  Service: Endoscopy;  Laterality: N/A;  12:15    History reviewed. No pertinent family history.  Social History   Socioeconomic History   Marital status: Divorced    Spouse name: Not on file   Number of children: Not on file   Years of education: Not on file   Highest education level: Not on file  Occupational History   Not on file  Tobacco Use   Smoking status:  Former    Current packs/day: 1.00    Average packs/day: 1 pack/day for 40.0 years (40.0 ttl pk-yrs)    Types: Cigarettes   Smokeless tobacco: Current   Tobacco comments:    1 pack a day  Substance and Sexual Activity   Alcohol use: No   Drug use: No   Sexual activity: Not on file  Other Topics Concern   Not on file  Social History Narrative   Not on file   Social Drivers of Health   Financial Resource Strain: Not on file  Food Insecurity: No Food Insecurity (11/15/2021)   Received from [facility], [facility]   Hunger Vital Sign    Worried About Running Out of Food in the Last Year: Never true    Ran Out of  Food in the Last Year: Never true  Transportation Needs: No Transportation Needs (02/23/2021)   Received from [facility], [facility]   PRAPARE - Transportation    Lack of Transportation (Medical): No    Lack of Transportation (Non-Medical): No  Physical Activity: Not on file  Stress: Not on file  Social Connections: Unknown (08/06/2021)   Received from [facility], [facility]   Social Network    Social Network: Not on file  Intimate Partner Violence: Unknown (06/28/2021)   Received from [facility], [facility]   HITS    Physically Hurt: Not on file    Insult or Talk Down To: Not on file    Threaten Physical Harm: Not on file    Scream or Curse: Not on file    Outpatient Medications Prior to Visit  Medication Sig Dispense Refill   amitriptyline (ELAVIL) 150 MG tablet TAKE 1 TABLET BY MOUTH AT BEDTIME. 30 tablet 0   amlodipine-olmesartan (AZOR) 10-20 MG tablet TAKE 1 TABLET BY MOUTH ONCE DAIY. 30 tablet 5   ASPIRIN EC ADULT LOW DOSE 81 MG tablet TAKE 1 TBALET BY MOUTH DAILY (SWALLOW WHOLE). 30 tablet 0   Iron, Ferrous Sulfate, 325 (65 Fe) MG TABS Take 325 mg by mouth daily. 30 tablet 5   pantoprazole (PROTONIX) 40 MG tablet Take 1 tablet (40 mg total) by mouth daily. 30 tablet 3   senna-docusate (SENOKOT-S) 8.6-50 MG tablet TAKE 2 TABLETS BY MOUTH 2 TIMES DAILY. 60 tablet 0   rosuvastatin (CRESTOR) 40 MG tablet Take 1 tablet (40 mg total) by mouth daily. 30 tablet 2   No facility-administered medications prior to visit.    No Known Allergies  ROS Review of Systems  Constitutional:  Negative for chills and fever.  HENT:  Negative for congestion, sinus pressure, sinus pain and sore throat.   Eyes:  Negative for pain and discharge.  Respiratory:  Negative for cough and shortness of breath.   Cardiovascular:  Negative for chest pain and palpitations.  Gastrointestinal:  Negative for abdominal pain, nausea and vomiting.  Endocrine: Negative for polydipsia and  polyuria.  Genitourinary:  Negative for dysuria and hematuria.  Musculoskeletal:  Negative for neck pain and neck stiffness.  Skin:  Negative for rash.       Laceration over left eyelid  Neurological:  Positive for speech difficulty and weakness. Negative for dizziness.  Psychiatric/Behavioral:  Positive for agitation, confusion and decreased concentration.       Objective:    Physical Exam Vitals reviewed.  Constitutional:      General: She is not in acute distress.    Appearance: She is not diaphoretic.  HENT:     Head: Normocephalic  and atraumatic.     Nose: Nose normal.     Mouth/Throat:     Mouth: Mucous membranes are moist.     Comments: Absent teeth Eyes:     General: No scleral icterus.    Extraocular Movements: Extraocular movements intact.  Cardiovascular:     Rate and Rhythm: Normal rate and regular rhythm.     Heart sounds: Normal heart sounds. No murmur heard. Pulmonary:     Breath sounds: Normal breath sounds. No wheezing or rales.  Musculoskeletal:     Cervical back: Neck supple. No tenderness.     Right lower leg: No edema.     Left lower leg: No edema.  Skin:    General: Skin is warm.     Findings: No rash.     Comments: Laceration over left eyelid - about 5 cm in length, 4 stitches removed today  Neurological:     General: No focal deficit present.     Mental Status: She is alert.     Comments: Alert and oriented to person and place  Psychiatric:        Mood and Affect: Mood is anxious.        Behavior: Behavior is cooperative.        Cognition and Memory: Memory is impaired.     BP 138/82 (BP Location: Left Arm)   Pulse 76   Ht 5\' 1"  (1.549 m)   Wt 90 lb 6.4 oz (41 kg)   SpO2 97%   BMI 17.08 kg/m  Wt Readings from Last 3 Encounters:  06/17/23 90 lb 6.4 oz (41 kg)  02/27/23 93 lb 3.2 oz (42.3 kg)  12/18/22 93 lb 9.6 oz (42.5 kg)    No results found for: "TSH" Lab Results  Component Value Date   WBC 7.3 11/21/2022   HGB 10.9 (L)  11/21/2022   HCT 32.6 (L) 11/21/2022   MCV 87 11/21/2022   PLT 364 11/21/2022   Lab Results  Component Value Date   NA 145 (H) 11/21/2022   K 3.4 (L) 11/21/2022   CO2 25 11/21/2022   GLUCOSE 110 (H) 11/21/2022   BUN 12 11/21/2022   CREATININE 0.56 (L) 11/21/2022   BILITOT 0.7 11/21/2022   ALKPHOS 112 11/21/2022   AST 23 11/21/2022   ALT 40 (H) 11/21/2022   PROT 6.6 11/21/2022   ALBUMIN 4.2 11/21/2022   CALCIUM 9.3 11/21/2022   ANIONGAP 12 10/02/2022   EGFR 101 11/21/2022   Lab Results  Component Value Date   CHOL 91 (L) 11/21/2022   Lab Results  Component Value Date   HDL 46 11/21/2022   Lab Results  Component Value Date   LDLCALC 29 11/21/2022   Lab Results  Component Value Date   TRIG 79 11/21/2022   Lab Results  Component Value Date   CHOLHDL 2.0 11/21/2022   Lab Results  Component Value Date   HGBA1C 5.6 11/21/2022      Assessment & Plan:   Problem List Items Addressed This Visit   None    No orders of the defined types were placed in this encounter.   Follow-up: No follow-ups on file.    Anabel Halon, MD

## 2023-06-18 NOTE — Assessment & Plan Note (Signed)
 Has residual expressive aphasia from it On aspirin and statin Dced Plavix after 1 year of CVA Had agitation due to expressive aphasia On amitriptyline for insomnia and MDD

## 2023-06-18 NOTE — Assessment & Plan Note (Signed)
 Hb slowly improving Last CBC during ER visit showed Hb of 13.6 On iron supplement On Pantoprazole for concern of PUD Denies GI evaluation

## 2023-06-18 NOTE — Assessment & Plan Note (Signed)
 BP Readings from Last 1 Encounters:  06/17/23 138/82   Well-controlled with Azor 10-20 mg QD Counseled for compliance with the medications Advised DASH diet

## 2023-06-18 NOTE — Assessment & Plan Note (Signed)
 She has longstanding history of MDD Has episodes of agitation at times due to expressive aphasia from CVA Continue amitriptyline 150 mg QD Has been referred to psychiatry, but did not follow up Her major concern is inability to express herself and feels frustrated due to it, but she is perceived as noncompliant and rude by caregivers at times

## 2023-07-02 ENCOUNTER — Ambulatory Visit (HOSPITAL_COMMUNITY)

## 2023-07-02 ENCOUNTER — Inpatient Hospital Stay (HOSPITAL_COMMUNITY): Admission: RE | Admit: 2023-07-02 | Source: Ambulatory Visit

## 2023-07-21 ENCOUNTER — Other Ambulatory Visit: Payer: Self-pay | Admitting: Internal Medicine

## 2023-07-21 DIAGNOSIS — D509 Iron deficiency anemia, unspecified: Secondary | ICD-10-CM

## 2023-07-21 DIAGNOSIS — K219 Gastro-esophageal reflux disease without esophagitis: Secondary | ICD-10-CM

## 2023-07-30 ENCOUNTER — Other Ambulatory Visit: Payer: Self-pay | Admitting: Internal Medicine

## 2023-10-31 ENCOUNTER — Other Ambulatory Visit: Payer: Self-pay | Admitting: Internal Medicine

## 2023-10-31 DIAGNOSIS — I10 Essential (primary) hypertension: Secondary | ICD-10-CM

## 2023-12-18 ENCOUNTER — Ambulatory Visit: Admitting: Internal Medicine

## 2023-12-23 ENCOUNTER — Encounter: Payer: Self-pay | Admitting: Internal Medicine

## 2024-01-29 ENCOUNTER — Other Ambulatory Visit: Payer: Self-pay | Admitting: Internal Medicine

## 2024-03-16 ENCOUNTER — Telehealth: Payer: Self-pay

## 2024-03-16 NOTE — Telephone Encounter (Signed)
 Copied from CRM #[id number]. Topic: Clinical - Medication Question >> Mar 16, 2024 11:36 AM Montie POUR wrote: Reason for CRM:  Bonnie Schneider with [employer] is calling to see if 2 medications that are listed on her FL2 is correct. Tylenol  650 mg and Risperidone 2 micrograms. Does she need to be taking these medications?  Please Ms. Berry at [phone]. You leave a message since she has secured voicemail.

## 2024-03-16 NOTE — Telephone Encounter (Signed)
 Ms Court [employer] advised

## 2024-04-16 ENCOUNTER — Telehealth: Payer: Self-pay

## 2024-04-16 NOTE — Telephone Encounter (Signed)
 Copied from CRM #[id number]. Topic: General - Other >> Apr 16, 2024 12:22 PM Avram MATSU wrote: Reason for CRM: Jama is call from [employer] asking to email the results of Mammogram 3/25   [email or web address] Fax:[phone]

## 2024-04-16 NOTE — Telephone Encounter (Signed)
 No mammogram was done on 06/17/23
